# Patient Record
Sex: Female | Born: 2000 | Race: Black or African American | Hispanic: No | Marital: Single
Health system: Southern US, Community
[De-identification: ages and names within clinical notes are randomized; demographics above are authoritative.]

## PROBLEM LIST (undated history)

## (undated) ENCOUNTER — Ambulatory Visit (HOSPITAL_COMMUNITY): Payer: Medicaid Other

## (undated) ENCOUNTER — Ambulatory Visit (HOSPITAL_COMMUNITY): Payer: MEDICAID | Source: Home / Self Care

## (undated) DIAGNOSIS — L309 Dermatitis, unspecified: Secondary | ICD-10-CM

---
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
## 2020-04-01 ENCOUNTER — Ambulatory Visit: Payer: Medicaid Other | Attending: Internal Medicine

## 2020-04-01 DIAGNOSIS — Z23 Encounter for immunization: Secondary | ICD-10-CM

## 2020-04-01 NOTE — Progress Notes (Signed)
   Covid-19 Vaccination Clinic  Name:  Manette Doto    MRN: [id number] DOB: September 02, 2000  04/01/2020  Ms. Riveron was observed post Covid-19 immunization for 15 minutes without incident. She was provided with Vaccine Information Sheet and instruction to access the V-Safe system.   Ms. Shannahan was instructed to call 911 with any severe reactions post vaccine: Marland Kitchen Difficulty breathing  . Swelling of face and throat  . A fast heartbeat  . A bad rash all over body  . Dizziness and weakness   Immunizations Administered    Name Date Dose VIS Date Route   Pfizer COVID-19 Vaccine 04/01/2020  3:23 PM 0.3 mL 10/17/2018 Intramuscular   Manufacturer: [employer], [employer]   Lot: [id number]   NDC: [address]-1

## 2020-04-22 ENCOUNTER — Ambulatory Visit: Payer: Self-pay

## 2020-11-17 ENCOUNTER — Other Ambulatory Visit: Payer: Self-pay

## 2020-11-17 ENCOUNTER — Ambulatory Visit (HOSPITAL_COMMUNITY)
Admission: EM | Admit: 2020-11-17 | Discharge: 2020-11-17 | Discharge status: Against Medical Advice | Payer: Medicaid Other

## 2021-01-07 ENCOUNTER — Other Ambulatory Visit: Payer: Self-pay

## 2021-01-07 ENCOUNTER — Ambulatory Visit (HOSPITAL_COMMUNITY)
Admission: EM | Admit: 2021-01-07 | Discharge: 2021-01-07 | Disposition: A | Discharge status: Home / Self Care | Payer: Medicaid Other

## 2021-01-07 ENCOUNTER — Encounter (HOSPITAL_COMMUNITY): Payer: Self-pay

## 2021-01-07 DIAGNOSIS — R112 Nausea with vomiting, unspecified: Secondary | ICD-10-CM | POA: Diagnosis not present

## 2021-01-07 NOTE — ED Provider Notes (Signed)
[facility]    CSN: [id number] Arrival date & time: 01/07/21  1055      History   Chief Complaint Chief Complaint  Patient presents with  . Emesis    HPI Rachel Ashley is a 20 y.o. female.   HPI   Emesis: Pt reports that she started vomiting on Sunday. She had 3 episodes of vomiting on Sunday, one on Monday and had a queezy stomach yesterday. She reports that all of her symptoms have resolved. She reports no urinary frequency, dysuria, diarrhea, abdominal pain, nausea, or fevers. She would like a return to work note.   Past Medical History:  Diagnosis Date  . Eczema     There are no problems to display for this patient.   History reviewed. No pertinent surgical history.  OB History   No obstetric history on file.      Home Medications    Prior to Admission medications   Medication Sig Start Date End Date Taking? Authorizing Provider  cetirizine (ZYRTEC) 10 MG tablet Take 1 tablet (10 mg total) by mouth daily. 02/06/18   Cathie Hoops, Amy V, PA-C  mineral oil-hydrophilic petrolatum (AQUAPHOR) ointment Apply topically as needed for dry skin. 05/11/17   Sherrilee Gilles, NP  tacrolimus (PROTOPIC) 0.1 % ointment Apply topically 2 (two) times daily. Patient not taking: Reported on 11/12/2019 05/31/17   Mardella Layman, MD  triamcinolone ointment (KENALOG) 0.1 % Apply 1 application topically 2 (two) times daily. Patient not taking: Reported on 11/12/2019 02/06/18   Lurline Idol    Family History Family History  Problem Relation Age of Onset  . Healthy Mother   . Healthy Father     Social History Social History   Tobacco Use  . Smoking status: Never Smoker  . Smokeless tobacco: Never Used  Vaping Use  . Vaping Use: Never used  Substance Use Topics  . Alcohol use: Never  . Drug use: Never     Allergies   Red dye   Review of Systems Review of Systems  As stated above in HPI Physical Exam Triage Vital Signs ED Triage Vitals  Enc Vitals Group      BP 01/07/21 1245 106/61     Pulse Rate 01/07/21 1245 85     Resp 01/07/21 1245 18     Temp 01/07/21 1245 99 F (37.2 C)     Temp Source 01/07/21 1245 Oral     SpO2 01/07/21 1245 98 %     Weight --      Height --      Head Circumference --      Peak Flow --      Pain Score 01/07/21 1244 0     Pain Loc --      Pain Edu? --      Excl. in [place]? --    No data found.  Updated Vital Signs BP 106/61 (BP Location: Left Arm)   Pulse 85   Temp 99 F (37.2 C) (Oral)   Resp 18   LMP 01/03/2021 (Exact Date)   SpO2 98%   Physical Exam Vitals and nursing note reviewed.  Constitutional:      Appearance: Normal appearance.  HENT:     Head: Normocephalic and atraumatic.     Mouth/Throat:     Mouth: Mucous membranes are moist.  Cardiovascular:     Rate and Rhythm: Normal rate and regular rhythm.     Heart sounds: Normal heart sounds.  Pulmonary:  Effort: Pulmonary effort is normal.     Breath sounds: Normal breath sounds.  Abdominal:     General: Abdomen is flat. Bowel sounds are normal. There is no distension.     Palpations: Abdomen is soft. There is no mass.     Tenderness: There is no abdominal tenderness. There is no right CVA tenderness, left CVA tenderness, guarding or rebound.     Hernia: No hernia is present.  Musculoskeletal:     Cervical back: Neck supple.  Lymphadenopathy:     Cervical: No cervical adenopathy.  Skin:    General: Skin is warm.  Neurological:     Mental Status: She is alert and oriented to person, place, and time.      UC Treatments / Results  Labs (all labs ordered are listed, but only abnormal results are displayed) Labs Reviewed - No data to display  EKG   Radiology No results found.  Procedures Procedures (including critical care time)  Medications Ordered in UC Medications - No data to display  Initial Impression / Assessment and Plan / UC Course  I have reviewed the triage vital signs and the nursing notes.  Pertinent labs  & imaging results that were available during my care of the patient were reviewed by me and considered in my medical decision making (see chart for details).     New. Likely gastroenteritis. Discussed precautions. Work note discussed and written.  Final Clinical Impressions(s) / UC Diagnoses   Final diagnoses:  None   Discharge Instructions   None    ED Prescriptions    None     PDMP not reviewed this encounter.   Rushie Chestnut, [place] 01/07/21 1325

## 2021-01-07 NOTE — ED Triage Notes (Signed)
Pt in for c/o vomiting that started Sunday while at work but has resolved  Pt states she needs a work note

## 2021-02-19 ENCOUNTER — Ambulatory Visit (HOSPITAL_COMMUNITY): Payer: Self-pay

## 2021-08-13 ENCOUNTER — Ambulatory Visit (HOSPITAL_COMMUNITY)
Admission: EM | Admit: 2021-08-13 | Discharge: 2021-08-13 | Disposition: A | Discharge status: Home / Self Care | Payer: Medicaid Other | Attending: Family Medicine | Admitting: Family Medicine

## 2021-08-13 ENCOUNTER — Encounter (HOSPITAL_COMMUNITY): Payer: Self-pay | Admitting: Emergency Medicine

## 2021-08-13 ENCOUNTER — Other Ambulatory Visit: Payer: Self-pay

## 2021-08-13 DIAGNOSIS — Z113 Encounter for screening for infections with a predominantly sexual mode of transmission: Secondary | ICD-10-CM | POA: Diagnosis present

## 2021-08-13 DIAGNOSIS — Z202 Contact with and (suspected) exposure to infections with a predominantly sexual mode of transmission: Secondary | ICD-10-CM

## 2021-08-13 NOTE — ED Provider Notes (Signed)
°  [facility]   [id number] 08/13/21 Arrival Time: 1402  ASSESSMENT & PLAN:  1. Screening for STDs (sexually transmitted diseases)    No empiric treatment.    Discharge Instructions      We have sent testing for sexually transmitted infections. We will notify you of any positive results once they are received. If required, we will prescribe any medications you might need.  Please refrain from all sexual activity for at least the next seven days.     Without s/s of PID.  Labs Reviewed  CERVICOVAGINAL ANCILLARY ONLY      Will notify of any positive results. Instructed to refrain from sexual activity for at least seven days.  Reviewed expectations re: course of current medical issues. Questions answered. Outlined signs and symptoms indicating need for more acute intervention. Patient verbalized understanding. After Visit Summary given.   SUBJECTIVE:  Rachel Ashley is a 20 y.o. female who requests STI check. No symptoms. Denies: urinary frequency, dysuria, and gross hematuria. Afebrile. No abdominal or pelvic pain. Normal PO intake wihout n/v. No genital rashes or lesions. Reports that she is sexually active with single female partner.  Patient's last menstrual period was 08/11/2021 (exact date).   OBJECTIVE:  Vitals:   08/13/21 1446  BP: 111/85  Pulse: (!) 106  Resp: 17  Temp: 97.8 F (36.6 C)  TempSrc: Oral  SpO2: 98%     General appearance: alert, cooperative, appears stated age and no distress Lungs: unlabored respirations; speaks full sentences without difficulty Back: no CVA tenderness; FROM at waist Abdomen: soft, non-tender GU: deferred Skin: warm and dry Psychological: alert and cooperative; normal mood and affect.    Labs Reviewed  CERVICOVAGINAL ANCILLARY ONLY    Allergies  Allergen Reactions   Red Dye     Past Medical History:  Diagnosis Date   Eczema    Family History  Problem Relation Age of Onset   Healthy Mother     Healthy Father    Social History   Socioeconomic History   Marital status: Single    Spouse name: Not on file   Number of children: Not on file   Years of education: Not on file   Highest education level: Not on file  Occupational History   Not on file  Tobacco Use   Smoking status: Never   Smokeless tobacco: Never  Vaping Use   Vaping Use: Never used  Substance and Sexual Activity   Alcohol use: Never   Drug use: Never   Sexual activity: Not on file  Other Topics Concern   Not on file  Social History Narrative   Not on file   Social Determinants of Health   Financial Resource Strain: Not on file  Food Insecurity: Not on file  Transportation Needs: Not on file  Physical Activity: Not on file  Stress: Not on file  Social Connections: Not on file  Intimate Partner Violence: Not on file           Mardella Layman, MD 08/13/21 1524

## 2021-08-13 NOTE — Discharge Instructions (Addendum)
We have sent testing for sexually transmitted infections. We will notify you of any positive results once they are received. If required, we will prescribe any medications you might need.  Please refrain from all sexual activity for at least the next seven days.  

## 2021-08-13 NOTE — ED Triage Notes (Signed)
Pt is present today for STD check. Pt denies any sx  

## 2021-08-14 ENCOUNTER — Telehealth (HOSPITAL_COMMUNITY): Payer: Self-pay | Admitting: Emergency Medicine

## 2021-08-14 LAB — CERVICOVAGINAL ANCILLARY ONLY
Bacterial Vaginitis (gardnerella): POSITIVE — AB
Candida Glabrata: NEGATIVE
Candida Vaginitis: POSITIVE — AB
Chlamydia: NEGATIVE
Comment: NEGATIVE
Comment: NEGATIVE
Comment: NEGATIVE
Comment: NEGATIVE
Comment: NEGATIVE
Comment: NORMAL
Neisseria Gonorrhea: NEGATIVE
Trichomonas: NEGATIVE

## 2021-08-14 MED ORDER — TERCONAZOLE 0.4 % VA CREA: 1.0000 | TOPICAL_CREAM | Freq: Every day | VAGINAL | 0 refills | Status: AC

## 2021-08-14 MED ORDER — METRONIDAZOLE 500 MG PO TABS: 500.0000 mg | ORAL_TABLET | Freq: Two times a day (BID) | ORAL | 0 refills | Status: DC

## 2021-08-15 IMAGING — DX DG KNEE COMPLETE 4+V*L*
4 series · 5 of 5 positions shown · non-contrast
Comparison: No prior.

CLINICAL DATA: Knee pain.

EXAM:
LEFT KNEE - COMPLETE 4+ VIEW

[knee ap]
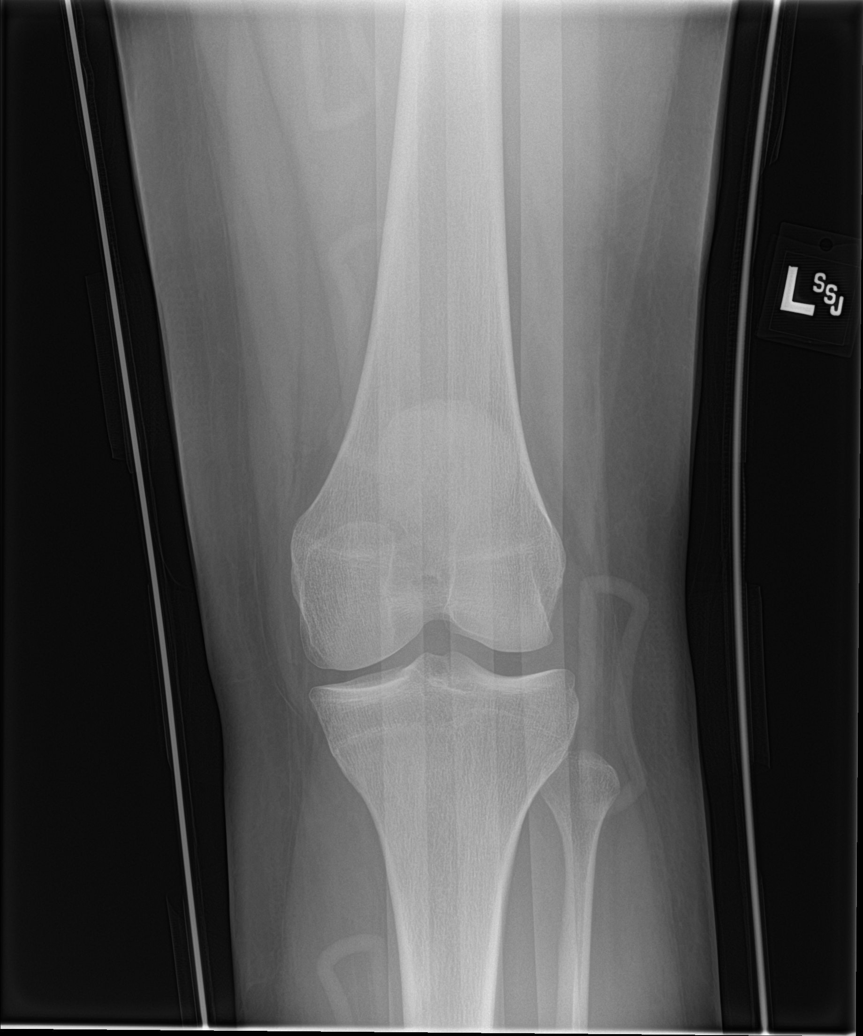

[Series 3: knee lat · 0.14mm/px · 2 of 2 slices shown]
[im 1/2]
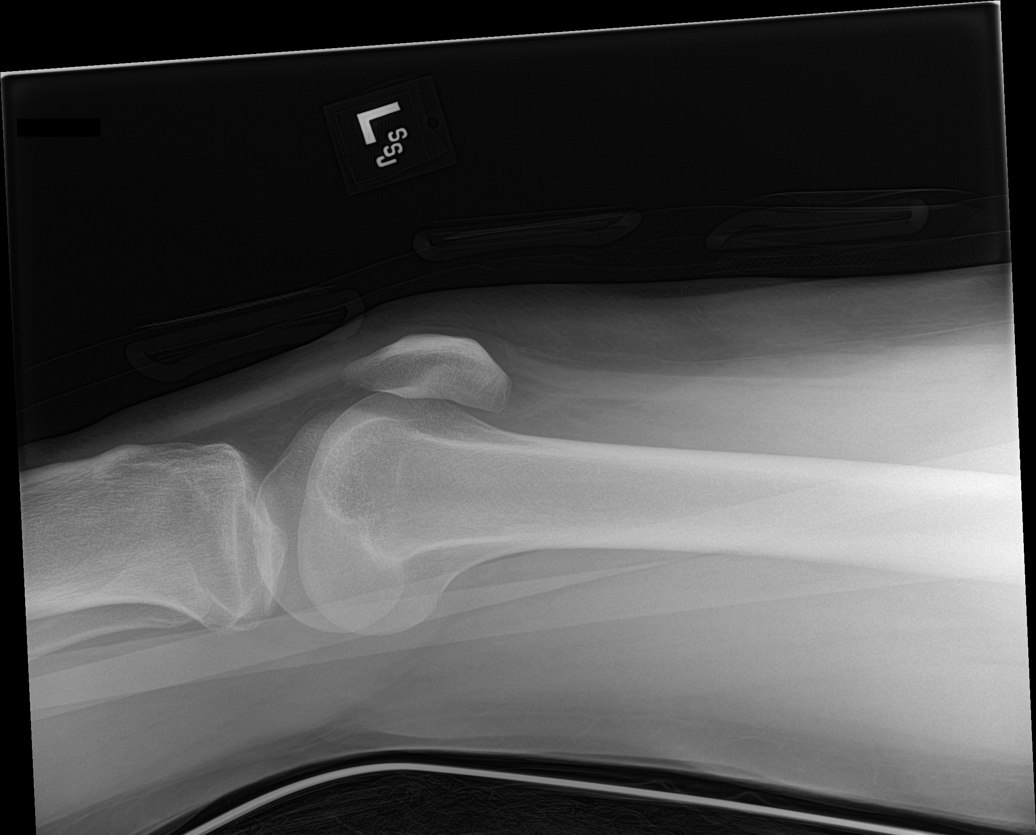
[im 2/2]
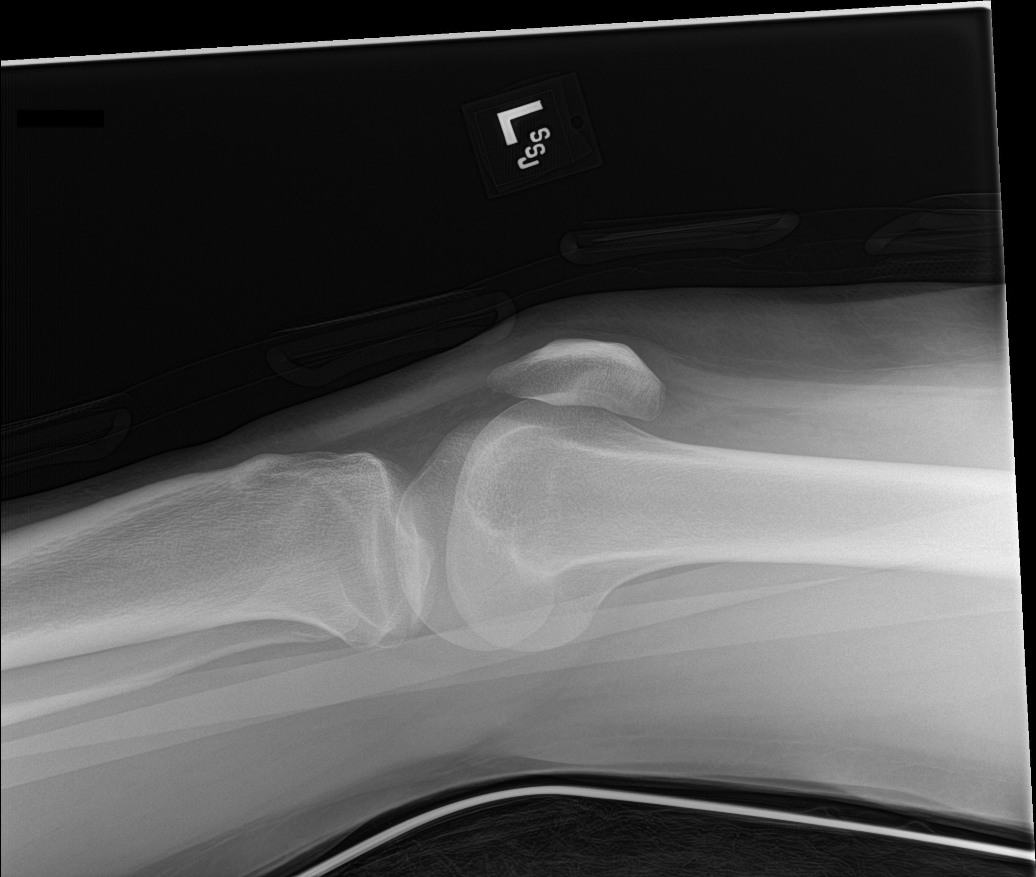

[knee obl (1 of 2)]
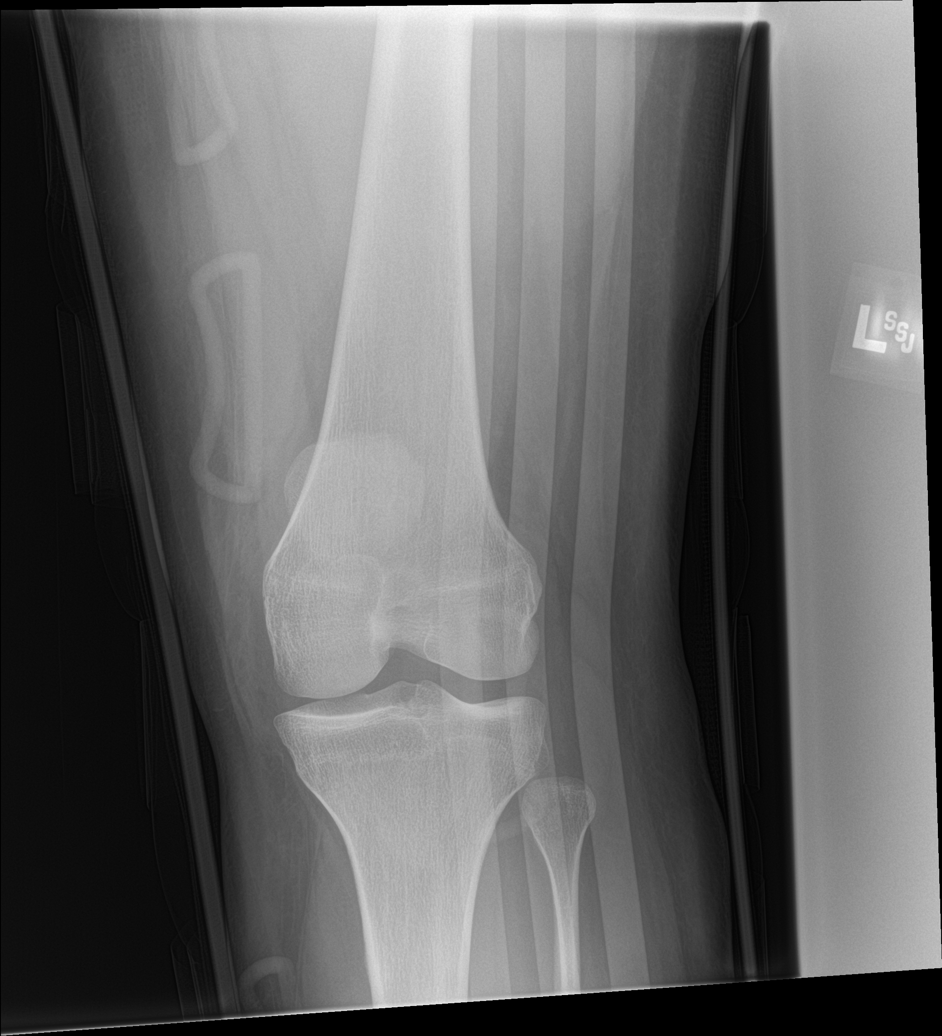

[knee obl (2 of 2)]
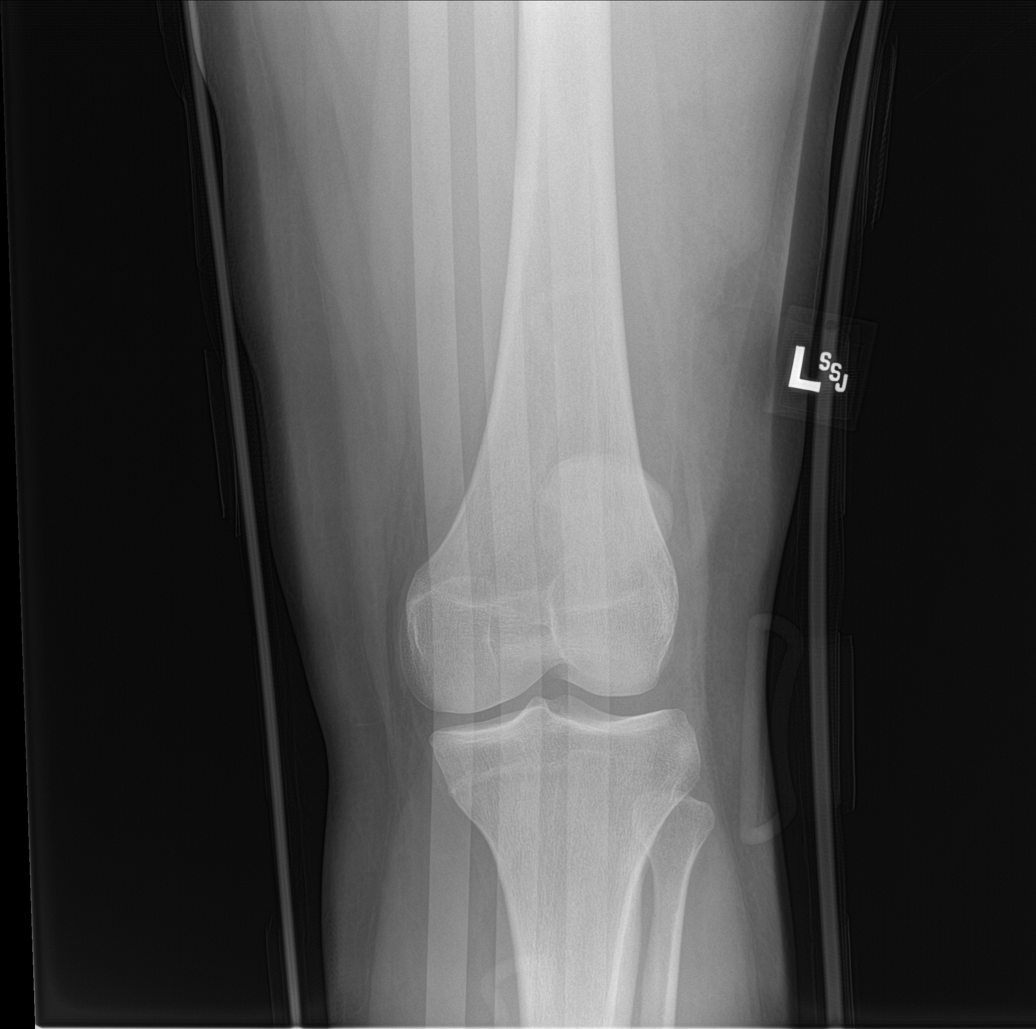

[5 of 5 positions shown; findings below may reference images not displayed]

FINDINGS: No acute bony or joint abnormality identified. No evidence of
fracture or dislocation. No evidence of effusion.
IMPRESSION: No acute abnormality.

## 2021-08-21 ENCOUNTER — Encounter (HOSPITAL_COMMUNITY): Payer: Self-pay | Admitting: Emergency Medicine

## 2021-08-21 ENCOUNTER — Emergency Department (HOSPITAL_COMMUNITY): Payer: Medicaid Other

## 2021-08-21 ENCOUNTER — Emergency Department (HOSPITAL_COMMUNITY)
Admission: EM | Admit: 2021-08-21 | Discharge: 2021-08-22 | Disposition: A | Discharge status: Home / Self Care | Payer: Medicaid Other | Attending: Emergency Medicine | Admitting: Emergency Medicine

## 2021-08-21 ENCOUNTER — Other Ambulatory Visit: Payer: Self-pay

## 2021-08-21 DIAGNOSIS — M25571 Pain in right ankle and joints of right foot: Secondary | ICD-10-CM | POA: Diagnosis present

## 2021-08-21 DIAGNOSIS — Z5321 Procedure and treatment not carried out due to patient leaving prior to being seen by health care provider: Secondary | ICD-10-CM | POA: Diagnosis not present
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
## 2021-08-21 NOTE — ED Notes (Signed)
No response x2 ?
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
## 2022-12-08 ENCOUNTER — Other Ambulatory Visit: Payer: Self-pay

## 2022-12-08 ENCOUNTER — Encounter (HOSPITAL_COMMUNITY): Payer: Self-pay

## 2022-12-08 ENCOUNTER — Emergency Department (HOSPITAL_COMMUNITY)
Admission: EM | Admit: 2022-12-08 | Discharge: 2022-12-08 | Discharge status: Against Medical Advice | Payer: Medicaid Other | Attending: Emergency Medicine | Admitting: Emergency Medicine

## 2022-12-08 DIAGNOSIS — F410 Panic disorder [episodic paroxysmal anxiety] without agoraphobia: Secondary | ICD-10-CM | POA: Insufficient documentation

## 2022-12-08 DIAGNOSIS — Z5321 Procedure and treatment not carried out due to patient leaving prior to being seen by health care provider: Secondary | ICD-10-CM | POA: Insufficient documentation

## 2022-12-08 NOTE — ED Notes (Signed)
Eloped through triage with mother. Says she no longer wishes to be evaluated by ER staff.
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
## 2022-12-08 NOTE — ED Notes (Signed)
Pt removed IV herself before eloping.

## 2023-01-21 ENCOUNTER — Encounter (HOSPITAL_COMMUNITY): Payer: Self-pay | Admitting: Emergency Medicine

## 2023-01-21 ENCOUNTER — Ambulatory Visit (HOSPITAL_COMMUNITY)
Admission: EM | Admit: 2023-01-21 | Discharge: 2023-01-21 | Disposition: A | Discharge status: Home / Self Care | Payer: Medicaid Other | Attending: Family Medicine | Admitting: Family Medicine

## 2023-01-21 DIAGNOSIS — N760 Acute vaginitis: Secondary | ICD-10-CM | POA: Diagnosis present

## 2023-01-21 LAB — POCT URINALYSIS DIP (MANUAL ENTRY)
Bilirubin, UA: NEGATIVE
Blood, UA: NEGATIVE
Glucose, UA: NEGATIVE mg/dL
Ketones, POC UA: NEGATIVE mg/dL
Nitrite, UA: NEGATIVE
Protein Ur, POC: NEGATIVE mg/dL
Spec Grav, UA: 1.02 (ref 1.010–1.025)
Urobilinogen, UA: 1 E.U./dL
pH, UA: 7 (ref 5.0–8.0)

## 2023-01-21 LAB — POCT URINE PREGNANCY: Preg Test, Ur: NEGATIVE

## 2023-01-21 MED ORDER — METRONIDAZOLE 500 MG PO TABS: 500.0000 mg | ORAL_TABLET | Freq: Two times a day (BID) | ORAL | 0 refills | Status: DC

## 2023-01-21 MED ORDER — TERCONAZOLE 0.8 % VA CREA: 1.0000 | TOPICAL_CREAM | Freq: Every day | VAGINAL | 0 refills | Status: AC

## 2023-01-21 NOTE — Discharge Instructions (Addendum)
Vaginal cytology pending and will result within 24 to 48 hours.  If any additional treatment is warranted after your results are available someone from our office will call you.  If not receive a call or a MyChart message no additional treatment is needed.

## 2023-01-21 NOTE — ED Triage Notes (Signed)
Pt c/o vaginal discharge and itching with bad odor that started yesterday. Pr report "dead skin" when wipes on tissue. Pt waxes.

## 2023-01-21 NOTE — ED Provider Notes (Signed)
[facility]    CSN: [id number] Arrival date & time: 01/21/23  1729      History   Chief Complaint Chief Complaint  Patient presents with   Vaginal Itching    HPI Rachel Ashley is a 22 y.o. female.   HPI Patient here for evaluation of abnormal vaginal discharge, abnormal vaginal odor and itching.  Patient endorses that she is sexually active and has no knowledge of being exposed to any STDs.  She reports symptoms have been present for about a day.  She does have a history of BV and yeast.  Endorses that the symptoms feel similar as before.  She denies any dysuria, flank pain nausea or vomiting. Patient's last menstrual period was 01/10/2023.    Past Medical History:  Diagnosis Date   Eczema     There are no problems to display for this patient.   History reviewed. No pertinent surgical history.  OB History   No obstetric history on file.      Home Medications    Prior to Admission medications   Medication Sig Start Date End Date Taking? Authorizing Provider  terconazole (TERAZOL 3) 0.8 % vaginal cream Place 1 applicator vaginally at bedtime for 3 days. 01/21/23 01/24/23 Yes Bing Neighbors, NP  cetirizine (ZYRTEC) 10 MG tablet Take 1 tablet (10 mg total) by mouth daily. 02/06/18   Cathie Hoops, Amy V, PA-C  metroNIDAZOLE (FLAGYL) 500 MG tablet Take 1 tablet (500 mg total) by mouth 2 (two) times daily. 01/21/23   Bing Neighbors, NP  mineral oil-hydrophilic petrolatum (AQUAPHOR) ointment Apply topically as needed for dry skin. 05/11/17   Sherrilee Gilles, NP  tacrolimus (PROTOPIC) 0.1 % ointment Apply topically 2 (two) times daily. Patient not taking: Reported on 11/12/2019 05/31/17   Mardella Layman, MD  triamcinolone ointment (KENALOG) 0.1 % Apply 1 application topically 2 (two) times daily. Patient not taking: Reported on 11/12/2019 02/06/18   Lurline Idol    Family History Family History  Problem Relation Age of Onset   Healthy Mother    Healthy Father      Social History Social History   Tobacco Use   Smoking status: Never   Smokeless tobacco: Never  Vaping Use   Vaping Use: Never used  Substance Use Topics   Alcohol use: Never   Drug use: Never     Allergies   Red dye   Review of Systems Review of Systems Pertinent negatives listed in HPI   Physical Exam Triage Vital Signs ED Triage Vitals  Enc Vitals Group     BP 01/21/23 1742 113/77     Pulse Rate 01/21/23 1742 94     Resp 01/21/23 1742 15     Temp 01/21/23 1742 98.8 F (37.1 C)     Temp Source 01/21/23 1742 Oral     SpO2 01/21/23 1742 98 %     Weight --      Height --      Head Circumference --      Peak Flow --      Pain Score 01/21/23 1740 0     Pain Loc --      Pain Edu? --      Excl. in GC? --    No data found.  Updated Vital Signs BP 113/77 (BP Location: Right Arm)   Pulse 94   Temp 98.8 F (37.1 C) (Oral)   Resp 15   LMP 01/10/2023   SpO2 98%   Visual  Acuity Right Eye Distance:   Left Eye Distance:   Bilateral Distance:    Right Eye Near:   Left Eye Near:    Bilateral Near:     Physical Exam General appearance: Alert, well developed, well nourished, cooperative  Head: Normocephalic, without obvious abnormality, atraumatic Respiratory: Respirations even and unlabored, normal respiratory rate Heart: rate and rhythm normal. No gallop or murmurs noted on exam  Abdomen: BS +, no distention, no rebound tenderness, or no mass Extremities: No gross deformities Skin: Skin color, texture, turgor normal. No rashes seen  Psych: Appropriate mood and affect. Neurologic: Mental status: Alert, oriented to person, place, and time, thought content appropriate.  UC Treatments / Results  Labs (all labs ordered are listed, but only abnormal results are displayed) Labs Reviewed  POCT URINALYSIS DIP (MANUAL ENTRY) - Abnormal; Notable for the following components:      Result Value   Leukocytes, UA Trace (*)    All other components within normal  limits  POCT URINE PREGNANCY  CERVICOVAGINAL ANCILLARY ONLY    EKG   Radiology No results found.  Procedures Procedures (including critical care time)  Medications Ordered in UC Medications - No data to display  Initial Impression / Assessment and Plan / UC Course  I have reviewed the triage vital signs and the nursing notes.  Pertinent labs & imaging results that were available during my care of the patient were reviewed by me and considered in my medical decision making (see chart for details).    Vaginal Cytology pending. Treating for vaginitis while awaiting results. Urine HCG negative. UA unremarkable.  Patient advised that we will only contact her if additional treatment is warranted after review of her results. Final Clinical Impressions(s) / UC Diagnoses   Final diagnoses:  Vaginitis and vulvovaginitis     Discharge Instructions      Vaginal cytology pending and will result within 24 to 48 hours.  If any additional treatment is warranted after your results are available someone from our office will call you.  If not receive a call or a MyChart message no additional treatment is needed.     ED Prescriptions     Medication Sig Dispense Auth. Provider   metroNIDAZOLE (FLAGYL) 500 MG tablet Take 1 tablet (500 mg total) by mouth 2 (two) times daily. 14 tablet Bing Neighbors, NP   terconazole (TERAZOL 3) 0.8 % vaginal cream Place 1 applicator vaginally at bedtime for 3 days. 20 g Bing Neighbors, NP      PDMP not reviewed this encounter.   Bing Neighbors, NP 01/21/23 [phone]

## 2023-01-25 LAB — CERVICOVAGINAL ANCILLARY ONLY
Bacterial Vaginitis (gardnerella): POSITIVE — AB
Candida Glabrata: NEGATIVE
Candida Vaginitis: POSITIVE — AB
Chlamydia: NEGATIVE
Comment: NEGATIVE
Comment: NEGATIVE
Comment: NEGATIVE
Comment: NEGATIVE
Comment: NEGATIVE
Comment: NORMAL
Neisseria Gonorrhea: NEGATIVE
Trichomonas: NEGATIVE

## 2023-02-22 ENCOUNTER — Encounter (HOSPITAL_COMMUNITY): Payer: Self-pay

## 2023-02-22 ENCOUNTER — Other Ambulatory Visit: Payer: Self-pay

## 2023-02-22 ENCOUNTER — Emergency Department (HOSPITAL_COMMUNITY)
Admission: EM | Admit: 2023-02-22 | Discharge: 2023-02-22 | Discharge status: Against Medical Advice | Payer: Medicaid Other | Attending: Emergency Medicine | Admitting: Emergency Medicine

## 2023-02-22 DIAGNOSIS — F410 Panic disorder [episodic paroxysmal anxiety] without agoraphobia: Secondary | ICD-10-CM | POA: Diagnosis present

## 2023-02-22 DIAGNOSIS — Z5321 Procedure and treatment not carried out due to patient leaving prior to being seen by health care provider: Secondary | ICD-10-CM | POA: Insufficient documentation

## 2023-02-22 NOTE — ED Triage Notes (Signed)
Pt arrived from home via Pov c/o being overwhelmed. Pt denies suicidal ideation. Pt crying in triage as she repeats that she is just very overwhelmed right now.

## 2023-02-22 NOTE — ED Notes (Signed)
Pt feels better and is leaving. Pt educated on leaving before exam.

## 2023-03-15 ENCOUNTER — Encounter (HOSPITAL_COMMUNITY): Payer: Self-pay

## 2023-03-15 ENCOUNTER — Ambulatory Visit (HOSPITAL_COMMUNITY)
Admission: RE | Admit: 2023-03-15 | Discharge: 2023-03-15 | Disposition: A | Discharge status: Home / Self Care | Payer: Medicaid Other | Source: Ambulatory Visit | Attending: Family Medicine | Admitting: Family Medicine

## 2023-03-15 VITALS — BP 112/77 | HR 86 | Temp 98.1°F | Resp 16

## 2023-03-15 DIAGNOSIS — Z113 Encounter for screening for infections with a predominantly sexual mode of transmission: Secondary | ICD-10-CM | POA: Insufficient documentation

## 2023-03-15 NOTE — Discharge Instructions (Signed)
We have sent testing for sexually transmitted infections. We will notify you of any positive results once they are received. If required, we will prescribe any medications you might need.  Please refrain from all sexual activity for at least the next seven days.

## 2023-03-15 NOTE — ED Provider Notes (Signed)
  [facility]   [id number] 03/15/23 Arrival Time: 1010  ASSESSMENT & PLAN:  1. Screening for STDs (sexually transmitted diseases)      Discharge Instructions      We have sent testing for sexually transmitted infections. We will notify you of any positive results once they are received. If required, we will prescribe any medications you might need.  Please refrain from all sexual activity for at least the next seven days.     Declines HIV/RPR testing. Without s/s of PID.  Labs Reviewed  CERVICOVAGINAL ANCILLARY ONLY   Will notify of any positive results. Instructed to refrain from sexual activity for at least seven days.  Reviewed expectations re: course of current medical issues. Questions answered. Outlined signs and symptoms indicating need for more acute intervention. Patient verbalized understanding. After Visit Summary given.   SUBJECTIVE:  Rachel Ashley is a 22 y.o. female who presents requests STI check up. She denies any exposures or sx.   with complaint of vaginal discharge.   Patient's last menstrual period was 03/04/2023 (exact date).   OBJECTIVE:  Vitals:   03/15/23 1038  BP: 112/77  Pulse: 86  Resp: 16  Temp: 98.1 F (36.7 C)  TempSrc: Oral  SpO2: 98%     General appearance: alert, cooperative, appears stated age and no distress Lungs: unlabored respirations; speaks full sentences without difficulty Back: no CVA tenderness; FROM at waist Abdomen: soft, non-tender GU: deferred Skin: warm and dry Psychological: alert and cooperative; normal mood and affect.    Labs Reviewed  CERVICOVAGINAL ANCILLARY ONLY    Allergies  Allergen Reactions   Red Dye     Past Medical History:  Diagnosis Date   Eczema    Family History  Problem Relation Age of Onset   Healthy Mother    Healthy Father    Social History   Socioeconomic History   Marital status: Single    Spouse name: Not on file   Number of children: Not on file    Years of education: Not on file   Highest education level: Not on file  Occupational History   Not on file  Tobacco Use   Smoking status: Some Days    Types: Cigarettes   Smokeless tobacco: Never  Vaping Use   Vaping status: Never Used  Substance and Sexual Activity   Alcohol use: Yes   Drug use: Not Currently    Types: Marijuana   Sexual activity: Yes    Birth control/protection: None  Other Topics Concern   Not on file  Social History Narrative   Not on file   Social Determinants of Health   Financial Resource Strain: Not on file  Food Insecurity: Not on file  Transportation Needs: Not on file  Physical Activity: Not on file  Stress: Not on file  Social Connections: Not on file  Intimate Partner Violence: Not on file           Mardella Layman, MD 03/15/23 1101

## 2023-03-15 NOTE — ED Triage Notes (Signed)
Pt states she would like STI check up. She denies any exposures or sx.

## 2023-03-16 ENCOUNTER — Telehealth: Payer: Self-pay | Admitting: Emergency Medicine

## 2023-03-16 LAB — CERVICOVAGINAL ANCILLARY ONLY
Bacterial Vaginitis (gardnerella): POSITIVE — AB
Candida Glabrata: NEGATIVE
Candida Vaginitis: NEGATIVE
Chlamydia: NEGATIVE
Comment: NEGATIVE
Comment: NEGATIVE
Comment: NEGATIVE
Comment: NEGATIVE
Comment: NEGATIVE
Comment: NORMAL
Neisseria Gonorrhea: NEGATIVE
Trichomonas: NEGATIVE

## 2023-03-16 MED ORDER — METRONIDAZOLE 500 MG PO TABS: 500.0000 mg | ORAL_TABLET | Freq: Two times a day (BID) | ORAL | 0 refills | Status: DC

## 2023-06-02 ENCOUNTER — Ambulatory Visit (HOSPITAL_COMMUNITY): Payer: Medicaid Other

## 2023-08-24 IMAGING — CR DG ANKLE COMPLETE 3+V*R*
3 series · 3 of 3 positions shown · non-contrast
Comparison: None.

CLINICAL DATA: Right ankle pain following injury

EXAM:
RIGHT ANKLE - COMPLETE 3+ VIEW

[ankle ap]
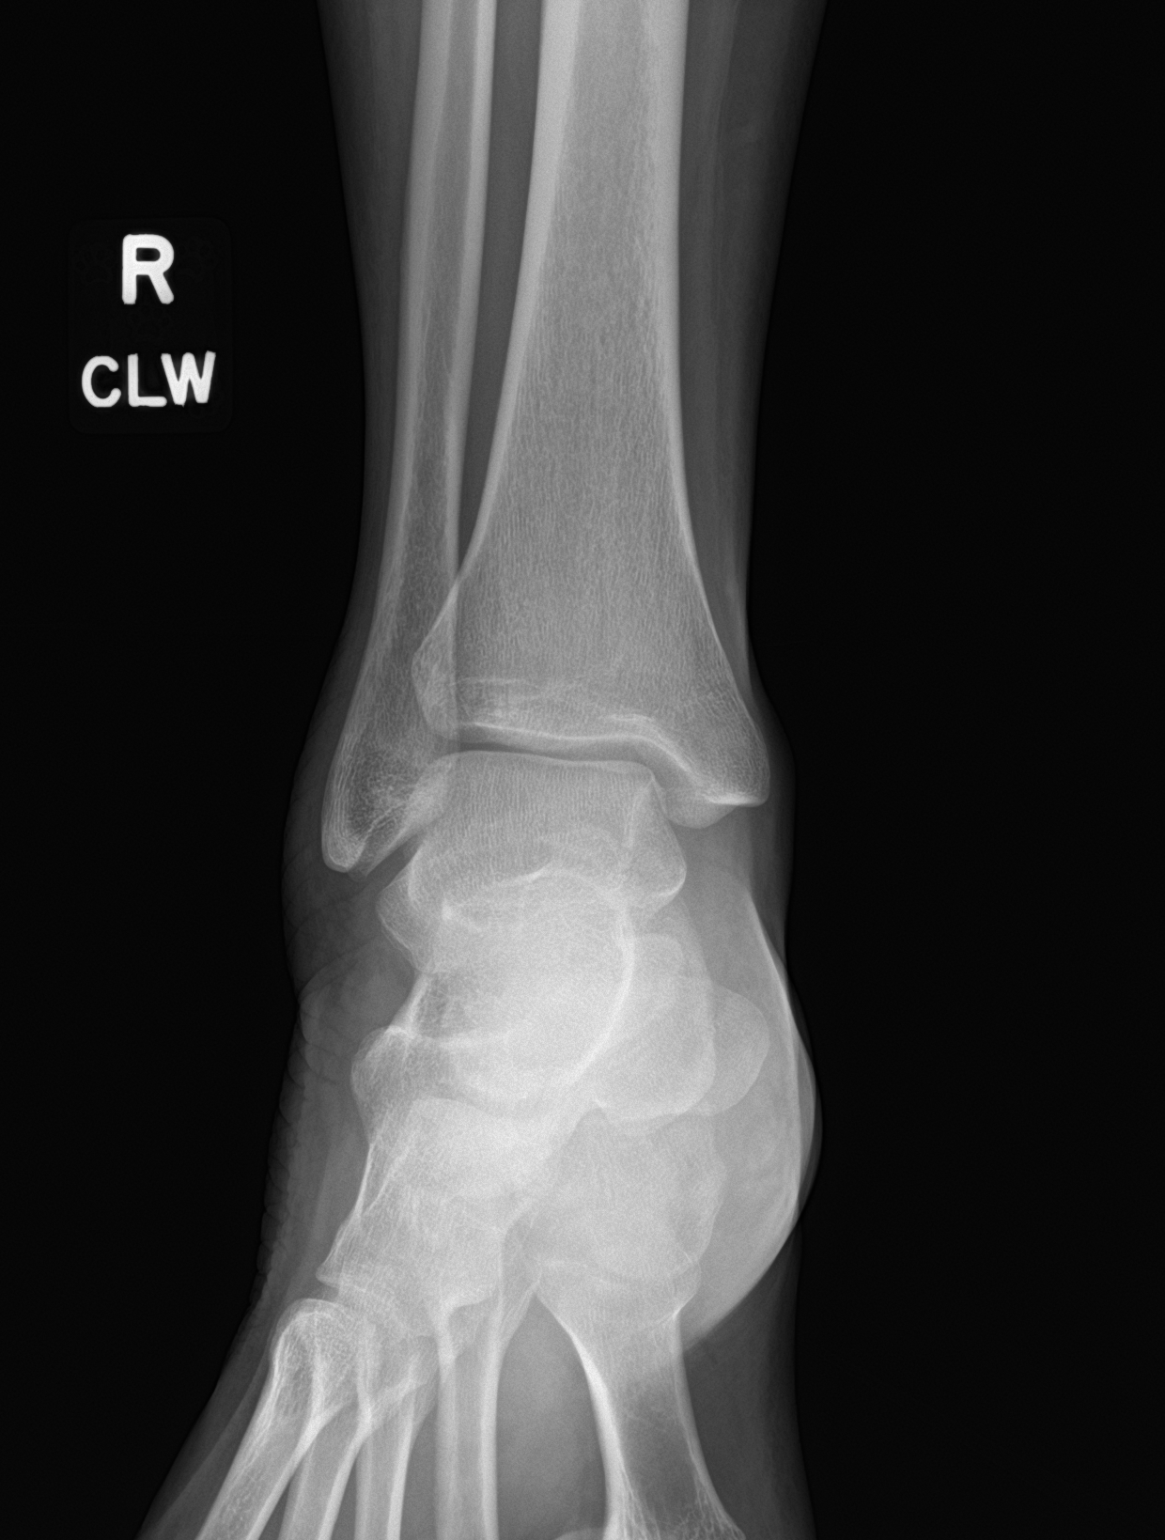

[ankle obl]
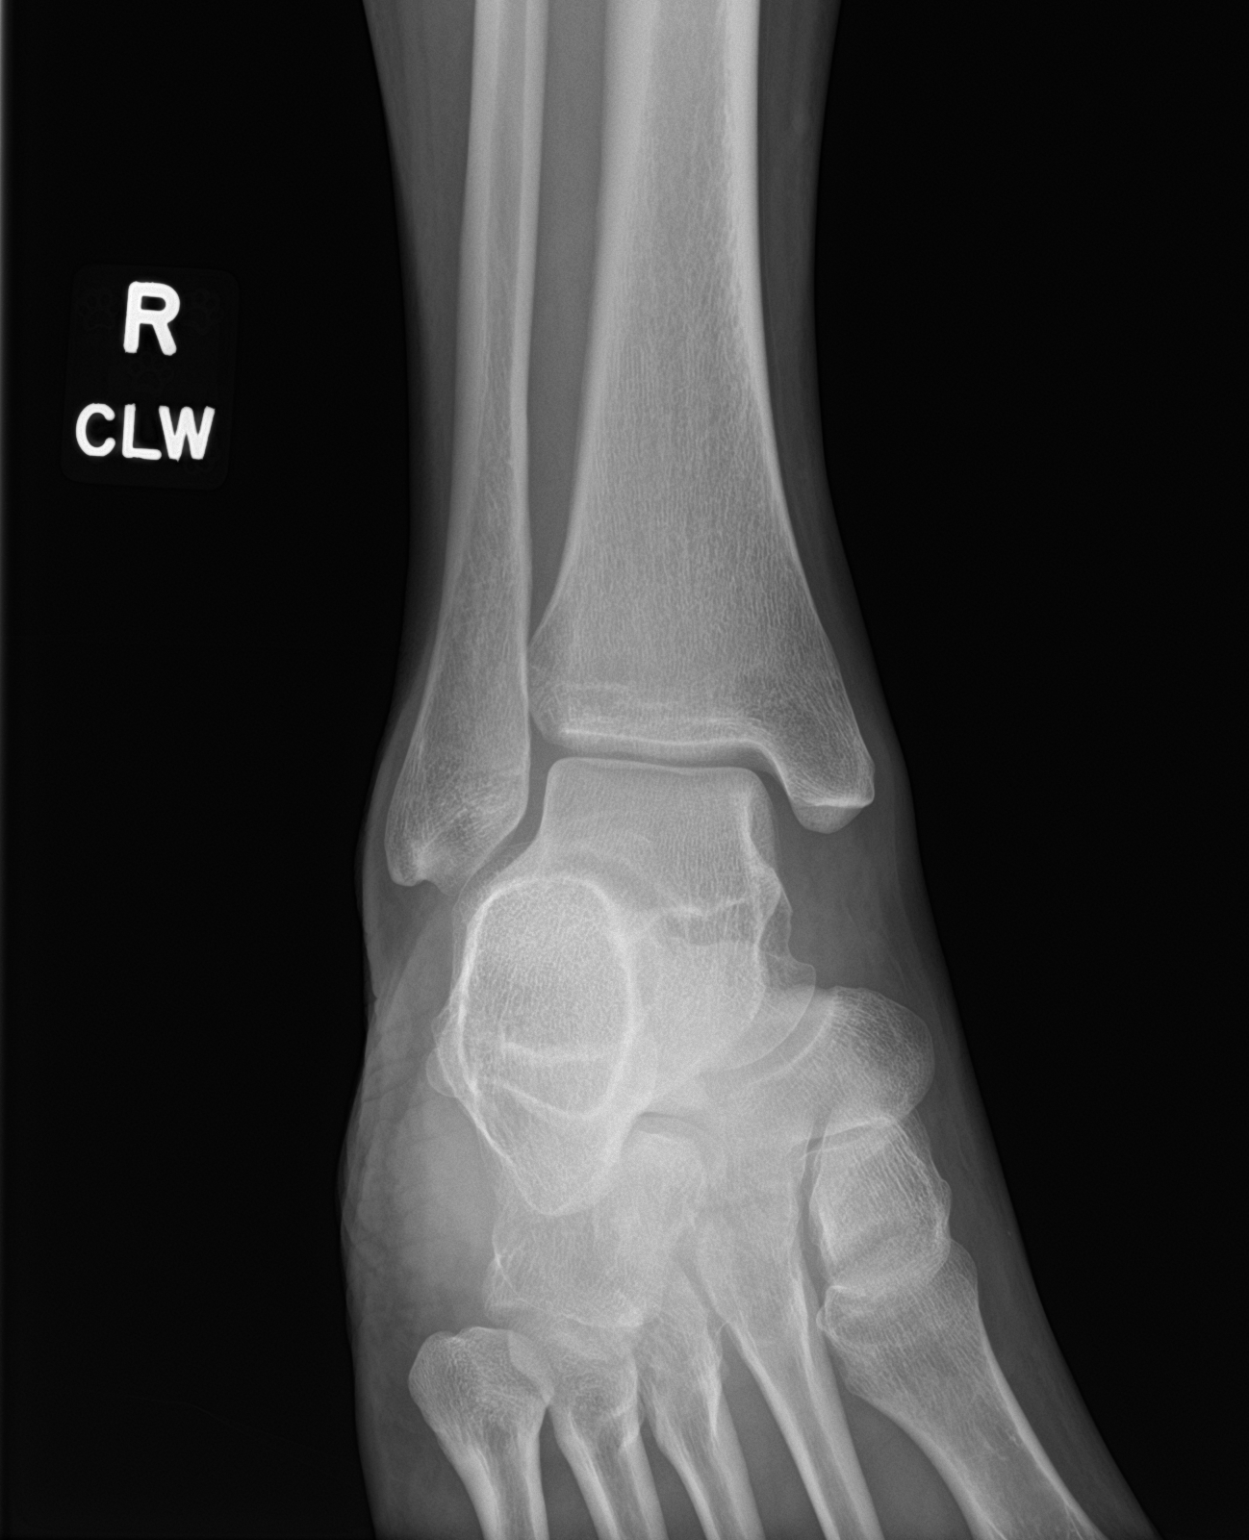

[ankle lat]
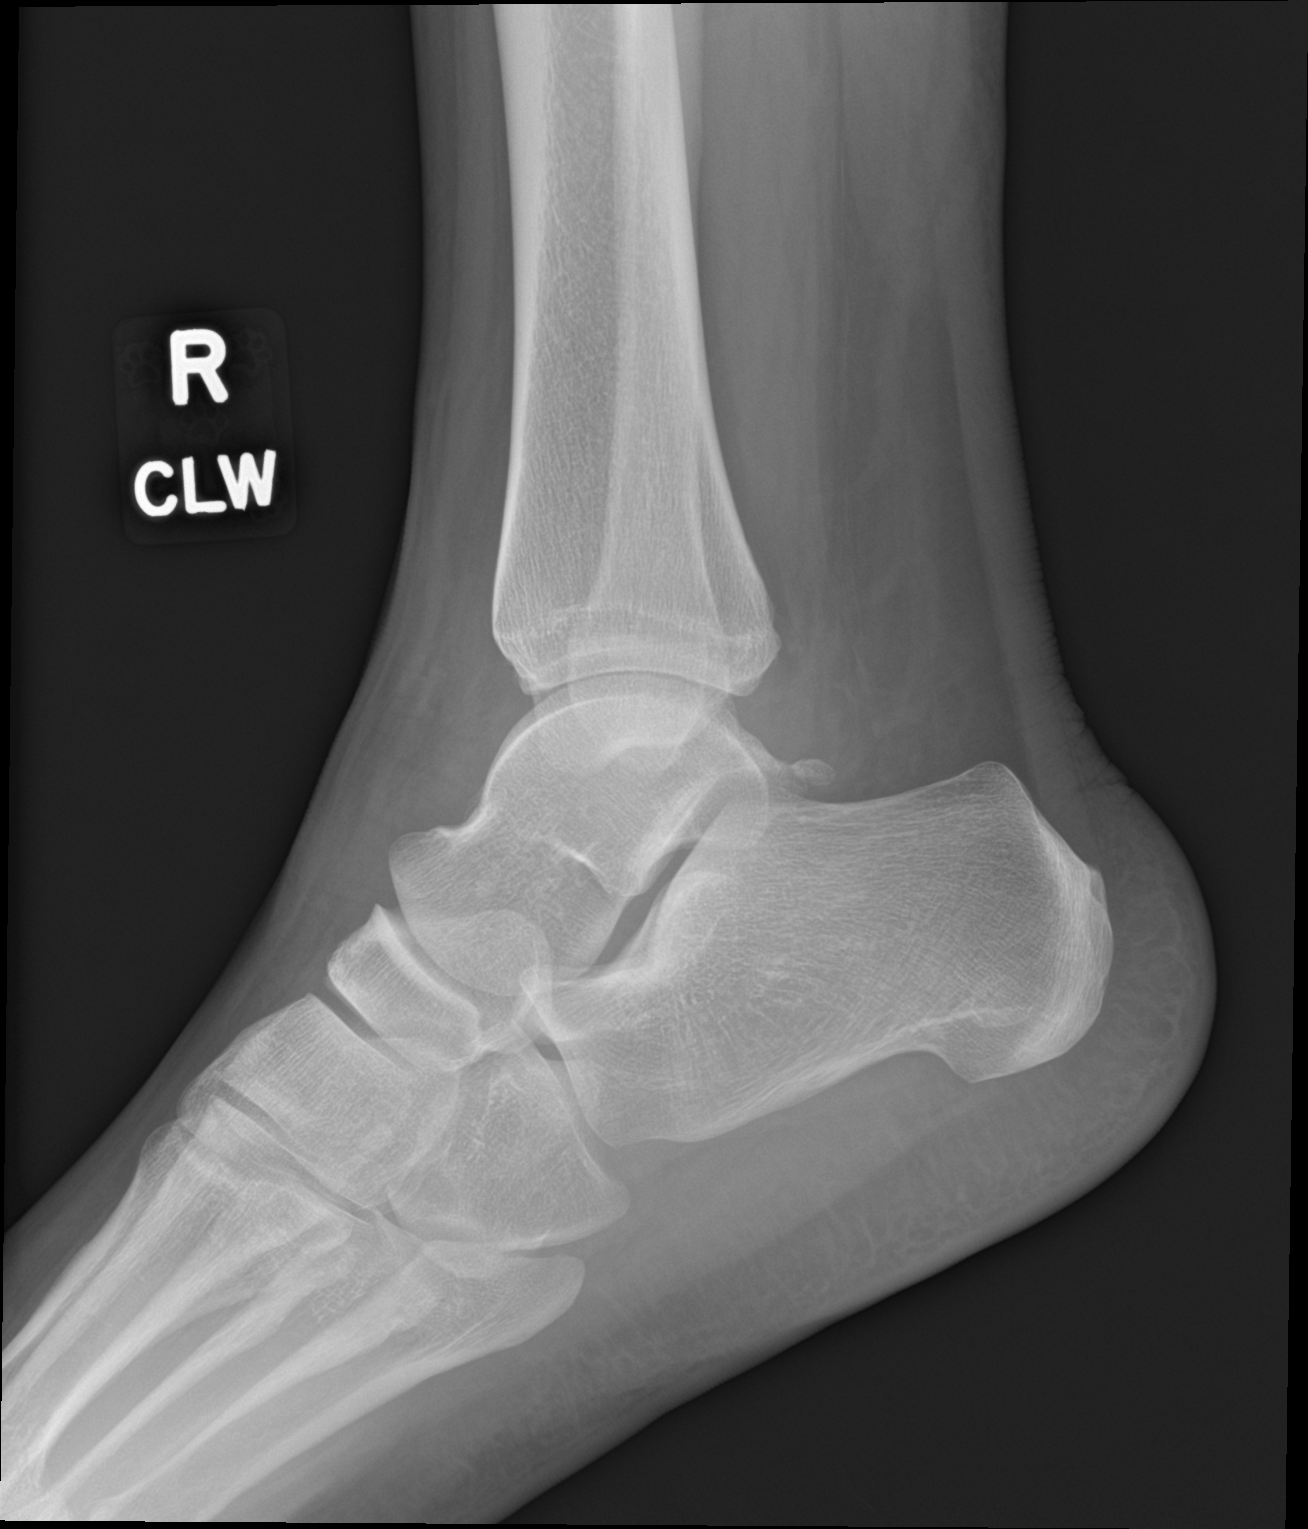

[3 of 3 positions shown; findings below may reference images not displayed]

FINDINGS: There is no evidence of fracture, dislocation, or joint effusion.
There is no evidence of arthropathy or other focal bone abnormality.
Soft tissues are unremarkable.
IMPRESSION: No acute osseous abnormality.

## 2023-08-27 ENCOUNTER — Emergency Department (HOSPITAL_COMMUNITY): Payer: MEDICAID

## 2023-08-27 ENCOUNTER — Emergency Department (HOSPITAL_COMMUNITY)
Admission: EM | Admit: 2023-08-27 | Discharge: 2023-08-27 | Disposition: A | Discharge status: Home / Self Care | Payer: MEDICAID | Attending: Emergency Medicine | Admitting: Emergency Medicine

## 2023-08-27 ENCOUNTER — Other Ambulatory Visit: Payer: Self-pay

## 2023-08-27 DIAGNOSIS — Y907 Blood alcohol level of 200-239 mg/100 ml: Secondary | ICD-10-CM | POA: Diagnosis not present

## 2023-08-27 DIAGNOSIS — F10920 Alcohol use, unspecified with intoxication, uncomplicated: Secondary | ICD-10-CM | POA: Diagnosis present

## 2023-08-27 LAB — CBC WITH DIFFERENTIAL/PLATELET
Abs Immature Granulocytes: 0.02 10*3/uL (ref 0.00–0.07)
Basophils Absolute: 0 10*3/uL (ref 0.0–0.1)
Basophils Relative: 1 %
Eosinophils Absolute: 0.1 10*3/uL (ref 0.0–0.5)
Eosinophils Relative: 1 %
HCT: 37.2 % (ref 36.0–46.0)
Hemoglobin: 12.6 g/dL (ref 12.0–15.0)
Immature Granulocytes: 0 %
Lymphocytes Relative: 34 %
Lymphs Abs: 2.8 10*3/uL (ref 0.7–4.0)
MCH: 31.8 pg (ref 26.0–34.0)
MCHC: 33.9 g/dL (ref 30.0–36.0)
MCV: 93.9 fL (ref 80.0–100.0)
Monocytes Absolute: 0.7 10*3/uL (ref 0.1–1.0)
Monocytes Relative: 9 %
Neutro Abs: 4.7 10*3/uL (ref 1.7–7.7)
Neutrophils Relative %: 55 %
Platelets: 337 10*3/uL (ref 150–400)
RBC: 3.96 MIL/uL (ref 3.87–5.11)
RDW: 11.9 % (ref 11.5–15.5)
WBC: 8.5 10*3/uL (ref 4.0–10.5)
nRBC: 0 % (ref 0.0–0.2)

## 2023-08-27 LAB — I-STAT CHEM 8, ED
BUN: 11 mg/dL (ref 6–20)
Calcium, Ion: 1.12 mmol/L — ABNORMAL LOW (ref 1.15–1.40)
Chloride: 109 mmol/L (ref 98–111)
Creatinine, Ser: 1.1 mg/dL — ABNORMAL HIGH (ref 0.44–1.00)
Glucose, Bld: 92 mg/dL (ref 70–99)
HCT: 37 % (ref 36.0–46.0)
Hemoglobin: 12.6 g/dL (ref 12.0–15.0)
Potassium: 3.8 mmol/L (ref 3.5–5.1)
Sodium: 144 mmol/L (ref 135–145)
TCO2: 25 mmol/L (ref 22–32)

## 2023-08-27 LAB — COMPREHENSIVE METABOLIC PANEL
ALT: 17 U/L (ref 0–44)
AST: 23 U/L (ref 15–41)
Albumin: 4 g/dL (ref 3.5–5.0)
Alkaline Phosphatase: 54 U/L (ref 38–126)
Anion gap: 11 (ref 5–15)
BUN: 11 mg/dL (ref 6–20)
CO2: 20 mmol/L — ABNORMAL LOW (ref 22–32)
Calcium: 9.6 mg/dL (ref 8.9–10.3)
Chloride: 109 mmol/L (ref 98–111)
Creatinine, Ser: 0.79 mg/dL (ref 0.44–1.00)
GFR, Estimated: >60 mL/min (ref >60)
Glucose, Bld: 97 mg/dL (ref 70–99)
Potassium: 3.9 mmol/L (ref 3.5–5.1)
Sodium: 140 mmol/L (ref 135–145)
Total Bilirubin: 0.4 mg/dL (ref 0.0–1.2)
Total Protein: 7.7 g/dL (ref 6.5–8.1)

## 2023-08-27 LAB — POC URINE PREG, ED: Preg Test, Ur: NEGATIVE

## 2023-08-27 LAB — URINALYSIS, ROUTINE W REFLEX MICROSCOPIC
Bacteria, UA: NONE SEEN
Bilirubin Urine: NEGATIVE
Glucose, UA: NEGATIVE mg/dL
Hgb urine dipstick: NEGATIVE
Ketones, ur: NEGATIVE mg/dL
Nitrite: NEGATIVE
Protein, ur: NEGATIVE mg/dL
Specific Gravity, Urine: 1.002 — ABNORMAL LOW (ref 1.005–1.030)
pH: 7 (ref 5.0–8.0)

## 2023-08-27 LAB — RAPID URINE DRUG SCREEN, HOSP PERFORMED
Amphetamines: NOT DETECTED
Barbiturates: NOT DETECTED
Benzodiazepines: NOT DETECTED
Cocaine: NOT DETECTED
Opiates: NOT DETECTED
Tetrahydrocannabinol: NOT DETECTED

## 2023-08-27 LAB — I-STAT CG4 LACTIC ACID, ED: Lactic Acid, Venous: 1.7 mmol/L (ref 0.5–1.9)

## 2023-08-27 LAB — TROPONIN I (HIGH SENSITIVITY): Troponin I (High Sensitivity): <2 ng/L (ref <18)

## 2023-08-27 LAB — ETHANOL: Alcohol, Ethyl (B): 204 mg/dL — ABNORMAL HIGH (ref <10)

## 2023-08-27 MED ORDER — ONDANSETRON HCL 4 MG/2ML IJ SOLN
4.0000 mg | Freq: Once | INTRAMUSCULAR | Status: AC
  Administered 2023-08-27: 4 mg via INTRAVENOUS
  Filled 2023-08-27: qty 2

## 2023-08-27 NOTE — Discharge Instructions (Addendum)
 Rachel Ashley was seen in the ER today for her panic attack and alcohol intoxication. Her workup was otherwise reassuring. Return to the ER with any new severe symptoms.

## 2023-08-27 NOTE — ED Triage Notes (Signed)
 Pt BIB friend who states that he thought she was having a panic attack and then passed out. When rolled back here pt passed out again. Pt oriented to name.

## 2023-08-27 NOTE — ED Provider Notes (Signed)
 [place] [facility] Provider Note   CSN: [id number] Arrival date & time: 08/27/23  0250     History  Chief Complaint  Patient presents with   Altered Mental Status    Rachel Ashley is a 23 y.o. female patient was initially checked into the Emergency Department as a Doe, was assisted from the vehicle by ED staff with report of syncope.  Patient's friend brought to the emergency department with concern for panic attack.  According to friends that are at the bedside patient did drink approximately 5 shots of liquor tonight, became quite intoxicated, history of panic attacks per friends at the bedside.  Began to hyperventilate stating she could not breathe requesting go to the ER.  Level 5 caveat due to acuity of presentation upon arrival. Collateral history subsequently obtained from patient's fianc at the bedside who corroborates history of panic attacks.  HPI     Home Medications Prior to Admission medications   Not on File      Allergies    Patient has no allergy information on record.    Review of Systems   Review of Systems  Unable to perform ROS: Mental status change    Physical Exam Updated Vital Signs BP (!) 102/52   Pulse 81   Temp 98.1 F (36.7 C) (Oral)   Resp 18   SpO2 99%  Physical Exam Vitals and nursing note reviewed.  Constitutional:      Appearance: She is not ill-appearing or toxic-appearing.     Comments: Appears intoxicated.   HENT:     Head: Normocephalic and atraumatic.     Mouth/Throat:     Mouth: Mucous membranes are moist.     Pharynx: No oropharyngeal exudate or posterior oropharyngeal erythema.  Eyes:     General:        Right eye: No discharge.        Left eye: No discharge.     Conjunctiva/sclera: Conjunctivae normal.  Cardiovascular:     Rate and Rhythm: Regular rhythm. Tachycardia present.     Pulses: Normal pulses.     Heart sounds: Normal heart sounds. No murmur heard.    Comments:  Crying, HR normalized when calm.  Pulmonary:     Effort: Pulmonary effort is normal. No respiratory distress.     Breath sounds: Normal breath sounds. No wheezing or rales.  Abdominal:     General: Bowel sounds are normal. There is no distension.     Tenderness: There is no abdominal tenderness.  Musculoskeletal:        General: No deformity.     Cervical back: Neck supple.  Skin:    General: Skin is warm and dry.  Neurological:     Mental Status: She is alert.  Psychiatric:        Mood and Affect: Mood normal.     ED Results / Procedures / Treatments   Labs (all labs ordered are listed, but only abnormal results are displayed) Labs Reviewed  COMPREHENSIVE METABOLIC PANEL - Abnormal; Notable for the following components:      Result Value   CO2 20 (*)    All other components within normal limits  URINALYSIS, ROUTINE W REFLEX MICROSCOPIC - Abnormal; Notable for the following components:   Color, Urine STRAW (*)    Specific Gravity, Urine 1.002 (*)    Leukocytes,Ua SMALL (*)    All other components within normal limits  ETHANOL - Abnormal; Notable for the following components:  Alcohol, Ethyl (B) 204 (*)    All other components within normal limits  I-STAT CHEM 8, ED - Abnormal; Notable for the following components:   Creatinine, Ser 1.10 (*)    Calcium, Ion 1.12 (*)    All other components within normal limits  CBC WITH DIFFERENTIAL/PLATELET  RAPID URINE DRUG SCREEN, HOSP PERFORMED  I-STAT CG4 LACTIC ACID, ED  POC URINE PREG, ED  TROPONIN I (HIGH SENSITIVITY)    EKG None  Radiology DG Chest Portable 1 View Result Date: 08/27/2023 CLINICAL DATA:  Syncope EXAM: PORTABLE CHEST 1 VIEW COMPARISON:  None Available. FINDINGS: The heart size and mediastinal contours are within normal limits. Both lungs are clear. The visualized skeletal structures are unremarkable. IMPRESSION: No active disease. Electronically Signed   By: Franky Crease M.D.   On: 08/27/2023 03:16     Procedures Procedures    Medications Ordered in ED Medications  ondansetron  (ZOFRAN ) injection 4 mg (4 mg Intravenous Given 08/27/23 0500)    ED Course/ Medical Decision Making/ A&P                                 Medical Decision Making 23 year old female presents with concern for panic attack and EtOH with question of syncope.  Tachycardiac on intake, resolved after patient stopped crying.  Cardiopulmonary exam unremarkable, abdominal exam benign.  Patient atraumatic, PERRL, EOMI.  Amount and/or Complexity of Data Reviewed Labs: ordered.    Details: CBC without leukocytosis or anemia, CMP unremarkable, alcohol elevated to 204.  Troponin negative, UDS and UA unremarkable, patient is not pregnant. Radiology: ordered.  Risk Prescription drug management.   Patient's fianc is at the bedside, states that this happens from time to time when she is intoxicated.  Patient has been able to ambulate independently to and from the restroom, is calm and cooperative, oriented x 4 at this time.  Patient and her fianc have family in the parking lot who are waiting to transport them home.  Clinically at this time patient remains mildly intoxicated but is safe to be discharged home in the care of her sober family.  Maragret and her fianc voiced understanding of her medical evaluation and treatment plan. Each of their questions answered to their expressed satisfaction.  Return precautions were given.  Patient is well-appearing, stable, and was discharged in good condition.  This chart was dictated using voice recognition software, Dragon. Despite the best efforts of this provider to proofread and correct errors, errors may still occur which can change documentation meaning.         Final Clinical Impression(s) / ED Diagnoses Final diagnoses:  Alcoholic intoxication without complication [facility])    Rx / DC Orders ED Discharge Orders     None         Bobette Pleasant SAUNDERS,  PA-C 08/27/23 0636    Midge Golas, MD 08/27/23 [phone]

## 2023-09-13 ENCOUNTER — Other Ambulatory Visit: Payer: Self-pay

## 2023-09-13 ENCOUNTER — Emergency Department (HOSPITAL_BASED_OUTPATIENT_CLINIC_OR_DEPARTMENT_OTHER)
Admission: EM | Admit: 2023-09-13 | Discharge: 2023-09-13 | Disposition: A | Discharge status: Home / Self Care | Payer: MEDICAID | Attending: Emergency Medicine | Admitting: Emergency Medicine

## 2023-09-13 DIAGNOSIS — H5711 Ocular pain, right eye: Secondary | ICD-10-CM | POA: Insufficient documentation

## 2023-09-13 DIAGNOSIS — Z113 Encounter for screening for infections with a predominantly sexual mode of transmission: Secondary | ICD-10-CM | POA: Diagnosis not present

## 2023-09-13 LAB — URINALYSIS, ROUTINE W REFLEX MICROSCOPIC
Bilirubin Urine: NEGATIVE
Glucose, UA: NEGATIVE mg/dL
Hgb urine dipstick: NEGATIVE
Ketones, ur: NEGATIVE mg/dL
Leukocytes,Ua: NEGATIVE
Nitrite: NEGATIVE
Protein, ur: NEGATIVE mg/dL
Specific Gravity, Urine: 1.02 (ref 1.005–1.030)
pH: 7 (ref 5.0–8.0)

## 2023-09-13 LAB — HIV ANTIBODY (ROUTINE TESTING W REFLEX): HIV Screen 4th Generation wRfx: NONREACTIVE

## 2023-09-13 LAB — WET PREP, GENITAL
Sperm: NONE SEEN
Trich, Wet Prep: NONE SEEN
WBC, Wet Prep HPF POC: <10 (ref <10)
Yeast Wet Prep HPF POC: NONE SEEN

## 2023-09-13 MED ORDER — ERYTHROMYCIN 5 MG/GM OP OINT
TOPICAL_OINTMENT | Freq: Once | OPHTHALMIC | Status: AC
  Administered 2023-09-13: 1 via OPHTHALMIC
  Filled 2023-09-13: qty 3.5

## 2023-09-13 MED ORDER — FLUORESCEIN SODIUM 1 MG OP STRP
1.0000 | ORAL_STRIP | Freq: Once | OPHTHALMIC | Status: AC
Start: 2023-09-13 — End: 2023-09-13
  Administered 2023-09-13: 1 via OPHTHALMIC
  Filled 2023-09-13: qty 1

## 2023-09-13 MED ORDER — TETRACAINE HCL 0.5 % OP SOLN
2.0000 [drp] | Freq: Once | OPHTHALMIC | Status: AC
  Administered 2023-09-13: 2 [drp] via OPHTHALMIC
  Filled 2023-09-13: qty 4

## 2023-09-13 NOTE — ED Provider Notes (Signed)
[place] [facility] Provider Note   CSN: [id number] Arrival date & time: 09/13/23  1227     History  Chief Complaint  Patient presents with   Eye Pain    Rachel Ashley is a 23 y.o. female.  Patient presents to the emergency department for evaluation of right eye pain.  Symptoms started in the early morning hours.  She states that she awoke with pain in her right eye.  No activities where she felt like something went into her eye.  She was wearing lashes.  No contact lenses or history of eye surgeries.  No vision change.  She had some mild tearing, no purulent discharge.  No matting of the eyes.  No pain or swelling around the eyes.   Patient also requesting STI check.  No symptoms reported.       Home Medications Prior to Admission medications   Medication Sig Start Date End Date Taking? Authorizing Provider  metroNIDAZOLE (FLAGYL) 500 MG tablet Take 1 tablet (500 mg total) by mouth 2 (two) times daily. 03/16/23   Merrilee Jansky, MD  tacrolimus (PROTOPIC) 0.1 % ointment Apply topically 2 (two) times daily. 05/31/17   Mardella Layman, MD  triamcinolone ointment (KENALOG) 0.1 % Apply 1 application topically 2 (two) times daily. 02/06/18   Cathie Hoops, Amy V, PA-C      Allergies    Red dye #40 (allura red)    Review of Systems   Review of Systems  Physical Exam Updated Vital Signs BP (!) 125/92 (BP Location: Left Arm)   Pulse 77   Temp 98.1 F (36.7 C) (Oral)   Resp 16   Ht 5\' 8"  (1.727 m)   Wt 90.7 kg   LMP 09/12/2023   SpO2 100%   BMI 30.41 kg/m  Physical Exam Vitals and nursing note reviewed.  Constitutional:      Appearance: She is well-developed.  HENT:     Head: Normocephalic and atraumatic.  Eyes:     General: Lids are normal.     Extraocular Movements:     Right eye: Normal extraocular motion and no nystagmus.     Left eye: Normal extraocular motion and no nystagmus.     Conjunctiva/sclera:     Right eye: Right  conjunctiva is injected (Minimal). No chemosis or exudate.    Left eye: Left conjunctiva is not injected. No chemosis or exudate.    Pupils: Pupils are equal, round, and reactive to light.     Right eye: Pupil is round, reactive and not sluggish. No corneal abrasion or fluorescein uptake.     Left eye: Pupil is round, reactive and not sluggish.  Pulmonary:     Effort: No respiratory distress.  Musculoskeletal:     Cervical back: Normal range of motion and neck supple.  Skin:    General: Skin is warm and dry.  Neurological:     Mental Status: She is alert.     ED Results / Procedures / Treatments   Labs (all labs ordered are listed, but only abnormal results are displayed) Labs Reviewed  WET PREP, GENITAL - Abnormal; Notable for the following components:      Result Value   Clue Cells Wet Prep HPF POC PRESENT (*)    All other components within normal limits  URINALYSIS, ROUTINE W REFLEX MICROSCOPIC  RPR  HIV ANTIBODY (ROUTINE TESTING W REFLEX)  GC/CHLAMYDIA PROBE AMP ([place]) NOT AT [facility]    EKG None  Radiology No  results found.  Procedures Procedures    Medications Ordered in ED Medications  fluorescein ophthalmic strip 1 strip (has no administration in time range)  tetracaine (PONTOCAINE) 0.5 % ophthalmic solution 2 drop (has no administration in time range)    ED Course/ Medical Decision Making/ A&P    Patient seen and examined. History obtained directly from patient.   Labs/EKG: None ordered  Imaging: None ordered  Medications/Fluids: Ordered: Fluorescein, tetracaine  Most recent vital signs reviewed and are as follows: BP (!) 125/92 (BP Location: Left Arm)   Pulse 77   Temp 98.1 F (36.7 C) (Oral)   Resp 16   Ht 5\' 8"  (1.727 m)   Wt 90.7 kg   LMP 09/12/2023   SpO2 100%   BMI 30.41 kg/m   Initial impression: Right eye pain, encounter for STI screening    Visual Acuity  Right Eye Distance:   Left Eye Distance:   Bilateral Distance:     Right Eye Near: R Near: 20/30 Left Eye Near:  L Near: 20/30 Bilateral Near:  20/25  Home treatment plan: Erythromycin ointment  Return instructions discussed with patient: New or worsening symptoms, worsening eye pain, vision change, swelling or redness around the eye  Follow-up instructions discussed with patient: Ophthalmology in 48 hours if not improving                                Medical Decision Making Amount and/or Complexity of Data Reviewed Labs: ordered.  Risk Prescription drug management.    Patient with right eye pain.  She was concerned about having something in her eye.  No foreign bodies noted. No surrounding erythema, swelling, vision changes/loss suspicious for orbital or periorbital cellulitis. No signs of iritis.  Low concern for glaucoma based on symptoms.  No symptoms of retinal detachment. No ophthalmologic emergency suspected. Outpatient referral given in case of no improvement.   Also requesting STI screening, asymptomatic.  No indication for treatment at this time.         Final Clinical Impression(s) / ED Diagnoses Final diagnoses:  Acute right eye pain  Screening for STDs (sexually transmitted diseases)    Rx / DC Orders ED Discharge Orders     None         Renne Crigler, PA-C 09/13/23 1510    Virgina Norfolk, DO 09/14/23 [phone]

## 2023-09-13 NOTE — Discharge Instructions (Signed)
Please read and follow all provided instructions.  Your diagnoses today include:  1. Acute right eye pain   2. Screening for STDs (sexually transmitted diseases)     Tests performed today include: Visual acuity testing to check your vision Fluorescein dye examination to look for scratches on your eye Vital signs. See below for your results today.   Medications prescribed:  Erythromycin  - antibiotic eye ointment  Use this medication as follows: Apply 1/4" of the antibiotic ointment to affected eye up to 6 times a day while awake for 7 days  Take any prescribed medications only as directed.  Home care instructions:  Follow any educational materials contained in this packet. If you wear contact lenses, do not use them until your eye caregiver approves. Follow-up care is necessary to be sure the infection is healing if not completely resolved in 2-3 days. See your caregiver or eye specialist as suggested for followup.   If you have an eye infection, wash your hands often as this is very contagious and is easily spread from person to person.   Follow-up instructions: Please follow-up with the opthalmologist listed in the next 2-3 days for further evaluation of your symptoms if not improving.  Return instructions:  Please return to the Emergency Department if you experience worsening symptoms.  Please return immediately if you develop severe pain, pus drainage, new change in vision, or fever. Please return if you have any other emergent concerns.  Additional Information:  Your vital signs today were: BP (!) 125/92 (BP Location: Left Arm)   Pulse 77   Temp 98.1 F (36.7 C) (Oral)   Resp 16   Ht 5\' 8"  (1.727 m)   Wt 90.7 kg   LMP 09/12/2023   SpO2 100%   BMI 30.41 kg/m  If your blood pressure (BP) was elevated above 135/85 this visit, please have this repeated by your doctor within one month. ---------------

## 2023-09-13 NOTE — ED Triage Notes (Signed)
Pt reports that she has something in her right eye. States that it has been there since last night. States that he vision is clear. Pt also would like a STD check.

## 2023-09-14 LAB — GC/CHLAMYDIA PROBE AMP (~~LOC~~) NOT AT ARMC
Chlamydia: NEGATIVE
Comment: NEGATIVE
Comment: NORMAL
Neisseria Gonorrhea: NEGATIVE

## 2023-09-14 LAB — RPR: RPR Ser Ql: NONREACTIVE

## 2023-12-22 ENCOUNTER — Ambulatory Visit (HOSPITAL_COMMUNITY): Payer: MEDICAID

## 2024-01-15 ENCOUNTER — Ambulatory Visit (HOSPITAL_COMMUNITY)
Admission: EM | Admit: 2024-01-15 | Discharge: 2024-01-15 | Disposition: A | Discharge status: Home / Self Care | Payer: MEDICAID | Attending: Emergency Medicine | Admitting: Emergency Medicine

## 2024-01-15 ENCOUNTER — Encounter (HOSPITAL_COMMUNITY): Payer: Self-pay

## 2024-01-15 DIAGNOSIS — A084 Viral intestinal infection, unspecified: Secondary | ICD-10-CM | POA: Diagnosis not present

## 2024-01-15 DIAGNOSIS — R112 Nausea with vomiting, unspecified: Secondary | ICD-10-CM

## 2024-01-15 DIAGNOSIS — R197 Diarrhea, unspecified: Secondary | ICD-10-CM | POA: Diagnosis not present

## 2024-01-15 DIAGNOSIS — Z3202 Encounter for pregnancy test, result negative: Secondary | ICD-10-CM

## 2024-01-15 LAB — POCT URINALYSIS DIP (MANUAL ENTRY)
Bilirubin, UA: NEGATIVE
Blood, UA: NEGATIVE
Glucose, UA: NEGATIVE mg/dL
Ketones, POC UA: NEGATIVE mg/dL
Leukocytes, UA: NEGATIVE
Nitrite, UA: NEGATIVE
Protein Ur, POC: NEGATIVE mg/dL
Spec Grav, UA: 1.025 (ref 1.010–1.025)
Urobilinogen, UA: 0.2 U/dL
pH, UA: 6 (ref 5.0–8.0)

## 2024-01-15 LAB — POCT URINE PREGNANCY: Preg Test, Ur: NEGATIVE

## 2024-01-15 MED ORDER — ONDANSETRON 4 MG PO TBDP: 4.0000 mg | ORAL_TABLET | Freq: Three times a day (TID) | ORAL | 0 refills | Status: AC | PRN

## 2024-01-15 NOTE — ED Provider Notes (Signed)
 [facility]    CSN: [id number] Arrival date & time: 01/15/24  1222      History   Chief Complaint Chief Complaint  Patient presents with   Abdominal Pain    HPI Rachel Ashley is a 23 y.o. female.   Patient presents with abdominal cramping, nausea, vomiting, and diarrhea that began around 9 AM this morning.  Patient states that she has had 2 episodes of vomiting and 2 episodes of diarrhea since 9 AM this morning.  Denies any blood in vomit or stool, recent issues with constipation, fever, body aches, chills, dysuria, urinary frequency/urgency, hematuria, abnormal vaginal discharge or bleeding.  LMP 01/01/2024.  Patient denies taking any medication for symptoms.  Patient denies eating anything new or any concerns for food poisoning.  Patient states that she had some chicken wings yesterday which is not out of the ordinary for her.  Denies any recent alcohol use.  The history is provided by the patient and medical records.  Abdominal Pain   Past Medical History:  Diagnosis Date   Eczema     There are no active problems to display for this patient.   History reviewed. No pertinent surgical history.  OB History   No obstetric history on file.      Home Medications    Prior to Admission medications   Medication Sig Start Date End Date Taking? Authorizing Provider  ondansetron  (ZOFRAN -ODT) 4 MG disintegrating tablet Take 1 tablet (4 mg total) by mouth every 8 (eight) hours as needed for nausea or vomiting. 01/15/24  Yes Levora Reas A, NP  triamcinolone  ointment (KENALOG ) 0.1 % Apply 1 application topically 2 (two) times daily. 02/06/18   Alfrieda Antes, PA-C    Family History Family History  Problem Relation Age of Onset   Healthy Mother    Healthy Father     Social History Social History   Tobacco Use   Smoking status: Never   Smokeless tobacco: Never  Vaping Use   Vaping status: Never Used  Substance Use Topics   Alcohol use: Yes   Drug use: Not  Currently    Types: Marijuana     Allergies   Red dye #40 (allura red)   Review of Systems Review of Systems  Gastrointestinal:  Positive for abdominal pain.   Per HPI  Physical Exam Triage Vital Signs ED Triage Vitals  Encounter Vitals Group     BP 01/15/24 1245 124/86     Systolic BP Percentile --      Diastolic BP Percentile --      Pulse Rate 01/15/24 1245 95     Resp 01/15/24 1245 16     Temp 01/15/24 1245 98.4 F (36.9 C)     Temp Source 01/15/24 1245 Oral     SpO2 01/15/24 1245 97 %     Weight --      Height --      Head Circumference --      Peak Flow --      Pain Score 01/15/24 1248 7     Pain Loc --      Pain Education --      Exclude from Growth Chart --    No data found.  Updated Vital Signs BP 124/86 (BP Location: Left Arm)   Pulse 95   Temp 98.4 F (36.9 C) (Oral)   Resp 16   LMP 01/01/2024 (Approximate)   SpO2 97%   Visual Acuity Right Eye Distance:   Left Eye Distance:  Bilateral Distance:    Right Eye Near:   Left Eye Near:    Bilateral Near:     Physical Exam Vitals and nursing note reviewed.  Constitutional:      General: She is awake. She is not in acute distress.    Appearance: Normal appearance. She is well-developed. She is not ill-appearing.  Abdominal:     General: Abdomen is flat. Bowel sounds are normal.     Palpations: Abdomen is soft.     Tenderness: There is no abdominal tenderness. There is no guarding or rebound.  Skin:    General: Skin is warm and dry.  Neurological:     Mental Status: She is alert.  Psychiatric:        Behavior: Behavior is cooperative.      UC Treatments / Results  Labs (all labs ordered are listed, but only abnormal results are displayed) Labs Reviewed  POCT URINE PREGNANCY - Normal  POCT URINALYSIS DIP (MANUAL ENTRY)    EKG   Radiology No results found.  Procedures Procedures (including critical care time)  Medications Ordered in UC Medications - No data to  display  Initial Impression / Assessment and Plan / UC Course  I have reviewed the triage vital signs and the nursing notes.  Pertinent labs & imaging results that were available during my care of the patient were reviewed by me and considered in my medical decision making (see chart for details).     Patient is well-appearing.  Vitals are stable.  Abdomen is flat, soft, and nontender upon palpation.  Bowel sounds are normal.  Without signs of acute abdomen.  Patient endorses constant central abdominal cramping and urge to have diarrhea.  Urinalysis unremarkable.  Urine pregnancy negative.  Symptoms likely viral in nature.  Prescribe Zofran  as needed for nausea and vomiting.  Recommended Imodium as needed for any diarrhea.  Discussed importance of hydration.  Discussed return and strict ER precautions. Final Clinical Impressions(s) / UC Diagnoses   Final diagnoses:  Nausea vomiting and diarrhea  Viral gastroenteritis     Discharge Instructions      Your urinalysis does not reveal any signs of infection. I believe your symptoms are likely related to a viral gastroenteritis, or stomach virus. You can take Zofran  every 8 hours as needed for nausea and vomiting. You can take over-the-counter Imodium 4 times daily as needed for any diarrhea. Make sure you are staying hydrated and getting some rest. Return here as needed. If you develop worsening abdominal pain, excessive vomiting, excessive diarrhea, blood in vomit or stool, or high fevers unrelieved by medication please seek immediate medical treatment in the emergency department.   ED Prescriptions     Medication Sig Dispense Auth. Provider   ondansetron  (ZOFRAN -ODT) 4 MG disintegrating tablet Take 1 tablet (4 mg total) by mouth every 8 (eight) hours as needed for nausea or vomiting. 10 tablet Levora Reas A, NP      PDMP not reviewed this encounter.   Levora Reas A, NP 01/15/24 1332

## 2024-01-15 NOTE — ED Triage Notes (Signed)
 Patient here today with c/o abd pain, nausea, vomiting, and diarrhea this morning. Patient states that she vomiting twice today.

## 2024-01-15 NOTE — Discharge Instructions (Addendum)
 Your urinalysis does not reveal any signs of infection. I believe your symptoms are likely related to a viral gastroenteritis, or stomach virus. You can take Zofran  every 8 hours as needed for nausea and vomiting. You can take over-the-counter Imodium 4 times daily as needed for any diarrhea. Make sure you are staying hydrated and getting some rest. Return here as needed. If you develop worsening abdominal pain, excessive vomiting, excessive diarrhea, blood in vomit or stool, or high fevers unrelieved by medication please seek immediate medical treatment in the emergency department.

## 2024-02-19 ENCOUNTER — Other Ambulatory Visit: Payer: Self-pay

## 2024-02-19 ENCOUNTER — Telehealth (HOSPITAL_COMMUNITY): Payer: Self-pay | Admitting: Radiology

## 2024-02-19 ENCOUNTER — Encounter (HOSPITAL_COMMUNITY): Payer: Self-pay | Admitting: Emergency Medicine

## 2024-02-19 ENCOUNTER — Ambulatory Visit (HOSPITAL_COMMUNITY)
Admission: EM | Admit: 2024-02-19 | Discharge: 2024-02-19 | Disposition: A | Discharge status: Home / Self Care | Payer: MEDICAID | Attending: Emergency Medicine | Admitting: Emergency Medicine

## 2024-02-19 ENCOUNTER — Ambulatory Visit (HOSPITAL_COMMUNITY): Payer: Self-pay | Admitting: Emergency Medicine

## 2024-02-19 DIAGNOSIS — Z113 Encounter for screening for infections with a predominantly sexual mode of transmission: Secondary | ICD-10-CM | POA: Diagnosis not present

## 2024-02-19 LAB — HIV ANTIBODY (ROUTINE TESTING W REFLEX): HIV Screen 4th Generation wRfx: NONREACTIVE

## 2024-02-19 NOTE — ED Triage Notes (Signed)
Pt requesting to be check for STD.

## 2024-02-19 NOTE — ED Provider Notes (Signed)
 [facility]    CSN: [id number] Arrival date & time: 02/19/24  1157    HISTORY   Chief Complaint  Patient presents with   Exposure to STD   HPI Rachel Ashley is a pleasant, 23 y.o. female who presents to urgent care today. Patient requests routine STD screening.   Patient denies abnormal vaginal discharge, abnormal vaginal odor, vaginal itching, vaginal irritation, dyspareunia, rash, suprapubic pain, genital lesion , burning during urination, increased frequency of urination, fever , body aches, chills, rigors, malaise, significant fatigue, and known STD exposure.   The history is provided by the patient.  Exposure to STD    Past Medical History:  Diagnosis Date   Eczema    There are no active problems to display for this patient.  History reviewed. No pertinent surgical history. OB History   No obstetric history on file.    Home Medications    Prior to Admission medications   Medication Sig Start Date End Date Taking? Authorizing Provider  ondansetron  (ZOFRAN -ODT) 4 MG disintegrating tablet Take 1 tablet (4 mg total) by mouth every 8 (eight) hours as needed for nausea or vomiting. 01/15/24   Johnie Flaming A, NP  triamcinolone  ointment (KENALOG ) 0.1 % Apply 1 application topically 2 (two) times daily. 02/06/18   Babara Greig GAILS, PA-C    Family History Family History  Problem Relation Age of Onset   Healthy Mother    Healthy Father    Social History Social History   Tobacco Use   Smoking status: Never   Smokeless tobacco: Never  Vaping Use   Vaping status: Never Used  Substance Use Topics   Alcohol use: Yes   Drug use: Not Currently    Types: Marijuana   Allergies   Red dye #40 (allura red)  Review of Systems Review of Systems Pertinent findings revealed after performing a 14 point review of systems has been noted in the history of present illness.  Physical Exam Vital Signs BP 116/79 (BP Location: Right Arm)   Pulse 100   Temp 98 F (36.7  C) (Oral)   Resp 16   LMP 01/29/2024 (Approximate)   SpO2 100%   No data found.  Physical Exam Vitals and nursing note reviewed.  Constitutional:      General: She is not in acute distress.    Appearance: Normal appearance. She is not ill-appearing.  HENT:     Head: Normocephalic and atraumatic.   Eyes:     General: Lids are normal.        Right eye: No discharge.        Left eye: No discharge.     Conjunctiva/sclera: Conjunctivae normal.     Right eye: Right conjunctiva is not injected.     Left eye: Left conjunctiva is not injected.   Neck:     Trachea: Trachea and phonation normal.   Cardiovascular:     Rate and Rhythm: Normal rate and regular rhythm.  Pulmonary:     Effort: Pulmonary effort is normal. No accessory muscle usage or prolonged expiration.     Breath sounds: No decreased air movement or transmitted upper airway sounds. No decreased breath sounds.  Genitourinary:    Comments: Patient politely declines pelvic exam today, patient provided a vaginal swab for testing.  Musculoskeletal:        General: Normal range of motion.     Cervical back: Normal range of motion and neck supple. Normal range of motion.   Skin:  General: Skin is warm and dry.   Neurological:     General: No focal deficit present.     Mental Status: She is alert and oriented to person, place, and time.   Psychiatric:        Mood and Affect: Mood normal.        Behavior: Behavior normal.        Thought Content: Thought content normal.        Judgment: Judgment normal.     Visual Acuity Right Eye Distance:   Left Eye Distance:   Bilateral Distance:    Right Eye Near:   Left Eye Near:    Bilateral Near:     UC Couse / Diagnostics / Procedures:     Radiology No results found.  Procedures Procedures (including critical care time) EKG  Pending results:  Labs Reviewed  HIV ANTIBODY (ROUTINE TESTING W REFLEX)  RPR  CERVICOVAGINAL ANCILLARY ONLY    Medications  Ordered in UC: Medications - No data to display  UC Diagnoses / Final Clinical Impressions(s)   I have reviewed the triage vital signs and the nursing notes.  Pertinent labs & imaging results that were available during my care of the patient were reviewed by me and considered in my medical decision making (see chart for details).    Final diagnoses:  Screen for STD (sexually transmitted disease)   STD screening was performed, patient advised that the results be posted to their MyChart and if any of the results are positive, they will be notified by phone, further treatment will be provided as indicated based on results of STD screening. Patient was advised to abstain from sexual intercourse until that they receive the results of their STD testing.  Patient was also advised to use condoms to protect themselves from STD exposure. Return precautions advised.  Drug allergies reviewed, all questions addressed.   Please see discharge instructions below for details of plan of care as provided to patient. ED Prescriptions   None    PDMP not reviewed this encounter.  Disposition Upon Discharge:  Condition: stable for discharge home  Patient presented with concern for an acute illness with associated systemic symptoms and significant discomfort requiring urgent management. In my opinion, this is a condition that a prudent lay person (someone who possesses an average knowledge of health and medicine) may potentially expect to result in complications if not addressed urgently such as respiratory distress, impairment of bodily function or dysfunction of bodily organs.   As such, the patient has been evaluated and assessed, work-up was performed and treatment was provided in alignment with urgent care protocols and evidence based medicine.  Patient/parent/caregiver has been advised that the patient may require follow up for further testing and/or treatment if the symptoms continue in spite of treatment,  as clinically indicated and appropriate.  Routine symptom specific, illness specific and/or disease specific instructions were discussed with the patient and/or caregiver at length.  Prevention strategies for avoiding STD exposure were also discussed.  The patient will follow up with their current PCP if and as advised. If the patient does not currently have a PCP we will assist them in obtaining one.   The patient may need specialty follow up if the symptoms continue, in spite of conservative treatment and management, for further workup, evaluation, consultation and treatment as clinically indicated and appropriate.  Patient/parent/caregiver verbalized understanding and agreement of plan as discussed.  All questions were addressed during visit.  Please see discharge instructions below for  further details of plan.    Discharge Instructions      The results of your vaginal swab test which screens for BV, yeast, gonorrhea, chlamydia and trichomonas will be posted to your MyChart account once it is complete.  This typically takes 2 to 4 days.  Please abstain from sexual intercourse of any kind, vaginal, oral or anal, until you have received the results of your STD testing.      The results of your HIV and syphilis blood tests will be made available to you once they are complete.  They will initially be posted to your MyChart account which typically takes 2 to 3 days.      Please remember that there are only 2 ways to prevent transmission of sexually transmitted disease: to not have sex or to always use condoms when having sex.       Thank you for visiting [facility] today.  We appreciate the opportunity to participate in your care.      This office note has been dictated using Teaching laboratory technician.  Unfortunately, this method of dictation can sometimes lead to typographical or grammatical errors.  I apologize for your inconvenience in advance if this occurs.  Please  do not hesitate to reach out to me if clarification is needed.       Joesph Shaver Scales, PA-C 02/19/24 1243

## 2024-02-19 NOTE — Telephone Encounter (Signed)
 Went to pt's room to draw blood and pt was not there. After asking nurse and provider we realized pt had left before blood draw. Pt was called 3 times on phone number provided on their chart with no answer.

## 2024-02-19 NOTE — Discharge Instructions (Addendum)
 The results of your vaginal swab test which screens for BV, yeast, gonorrhea, chlamydia and trichomonas will be posted to your MyChart account once it is complete.  This typically takes 2 to 4 days.  Please abstain from sexual intercourse of any kind, vaginal, oral or anal, until you have received the results of your STD testing.      The results of your HIV and syphilis blood tests will be made available to you once they are complete.  They will initially be posted to your MyChart account which typically takes 2 to 3 days.      Please remember that there are only 2 ways to prevent transmission of sexually transmitted disease: to not have sex or to always use condoms when having sex.       Thank you for visiting [facility] today.  We appreciate the opportunity to participate in your care.

## 2024-02-20 LAB — RPR: RPR Ser Ql: NONREACTIVE

## 2024-02-21 LAB — CERVICOVAGINAL ANCILLARY ONLY
Bacterial Vaginitis (gardnerella): POSITIVE — AB
Candida Glabrata: NEGATIVE
Candida Vaginitis: NEGATIVE
Chlamydia: NEGATIVE
Comment: NEGATIVE
Comment: NEGATIVE
Comment: NEGATIVE
Comment: NEGATIVE
Comment: NEGATIVE
Comment: NORMAL
Neisseria Gonorrhea: NEGATIVE
Trichomonas: NEGATIVE

## 2024-05-22 ENCOUNTER — Ambulatory Visit (HOSPITAL_COMMUNITY): Payer: MEDICAID

## 2024-08-29 ENCOUNTER — Encounter (HOSPITAL_COMMUNITY): Payer: Self-pay

## 2024-08-29 ENCOUNTER — Other Ambulatory Visit: Payer: Self-pay

## 2024-08-29 ENCOUNTER — Emergency Department (HOSPITAL_COMMUNITY)
Admission: EM | Admit: 2024-08-29 | Discharge: 2024-08-30 | Disposition: A | Discharge status: Home / Self Care | Payer: MEDICAID | Attending: Emergency Medicine | Admitting: Emergency Medicine

## 2024-08-29 DIAGNOSIS — F32A Depression, unspecified: Secondary | ICD-10-CM | POA: Diagnosis present

## 2024-08-29 LAB — ACETAMINOPHEN LEVEL: Acetaminophen (Tylenol), Serum: <10 ug/mL — ABNORMAL LOW (ref 10–30)

## 2024-08-29 LAB — CBC WITH DIFFERENTIAL/PLATELET
Abs Immature Granulocytes: 0.01 K/uL (ref 0.00–0.07)
Basophils Absolute: 0 K/uL (ref 0.0–0.1)
Basophils Relative: 1 %
Eosinophils Absolute: 0.1 K/uL (ref 0.0–0.5)
Eosinophils Relative: 2 %
HCT: 37.8 % (ref 36.0–46.0)
Hemoglobin: 13 g/dL (ref 12.0–15.0)
Immature Granulocytes: 0 %
Lymphocytes Relative: 37 %
Lymphs Abs: 1.6 K/uL (ref 0.7–4.0)
MCH: 32.2 pg (ref 26.0–34.0)
MCHC: 34.4 g/dL (ref 30.0–36.0)
MCV: 93.6 fL (ref 80.0–100.0)
Monocytes Absolute: 0.5 K/uL (ref 0.1–1.0)
Monocytes Relative: 11 %
Neutro Abs: 2.1 K/uL (ref 1.7–7.7)
Neutrophils Relative %: 49 %
Platelets: 262 K/uL (ref 150–400)
RBC: 4.04 MIL/uL (ref 3.87–5.11)
RDW: 12 % (ref 11.5–15.5)
WBC: 4.4 K/uL (ref 4.0–10.5)
nRBC: 0 % (ref 0.0–0.2)

## 2024-08-29 LAB — BASIC METABOLIC PANEL WITH GFR
Anion gap: 10 (ref 5–15)
BUN: 7 mg/dL (ref 6–20)
CO2: 23 mmol/L (ref 22–32)
Calcium: 9.1 mg/dL (ref 8.9–10.3)
Chloride: 104 mmol/L (ref 98–111)
Creatinine, Ser: 0.83 mg/dL (ref 0.44–1.00)
GFR, Estimated: >60 mL/min
Glucose, Bld: 82 mg/dL (ref 70–99)
Potassium: 3.5 mmol/L (ref 3.5–5.1)
Sodium: 137 mmol/L (ref 135–145)

## 2024-08-29 LAB — HCG, SERUM, QUALITATIVE: Preg, Serum: NEGATIVE

## 2024-08-29 LAB — ETHANOL: Alcohol, Ethyl (B): <15 mg/dL

## 2024-08-29 LAB — SALICYLATE LEVEL: Salicylate Lvl: <7 mg/dL — ABNORMAL LOW (ref 7.0–30.0)

## 2024-08-29 MED ORDER — LORAZEPAM 0.5 MG PO TABS
0.5000 mg | ORAL_TABLET | Freq: Once | ORAL | Status: AC
  Administered 2024-08-29: 0.5 mg via ORAL
  Filled 2024-08-29: qty 1

## 2024-08-29 NOTE — ED Provider Triage Note (Signed)
 Emergency Medicine Provider Triage Evaluation Note  Rachel Ashley , a 24 y.o. female  was evaluated in triage.  Pt complains of SI without plan.  Reports anxiety and depression without official diagnosis.  Review of Systems  Positive: SI without plan Negative: HI, AVH, previous psych history, any other symptoms at this time  Physical Exam  BP 110/86 (BP Location: Right Arm)   Pulse 91   Temp 98.5 F (36.9 C)   Resp 16   Wt 86.6 kg   LMP 08/27/2024 (Exact Date)   SpO2 94%   BMI 29.04 kg/m  Gen:   Awake, no distress   Resp:  Normal effort  MSK:   Moves extremities without difficulty  Other:    Medical Decision Making  Medically screening exam initiated at 5:19 PM.  Appropriate orders placed.  Devi Hopman was informed that the remainder of the evaluation will be completed by another provider, this initial triage assessment does not replace that evaluation, and the importance of remaining in the ED until their evaluation is complete.  Labs and medication ordered   Javae Braaten N, PA-C 08/29/24 1724

## 2024-08-29 NOTE — ED Triage Notes (Signed)
 Patient here POV with suicidal thoughts. She has no plan she is just having thoughts of wanting to kill herself. She states that the thoughts started 4 months ago but they did not get bad until 2 weeks ago.  No previous mental health diagnoses.

## 2024-08-29 NOTE — ED Triage Notes (Signed)
 Pt wishes not to have mother here.

## 2024-08-30 NOTE — Discharge Instructions (Addendum)
 Please seek immediate help if you become suicidal.  You may follow-up at the behavioral health urgent care for mental health needs. I've also provided outpatient mental health resources.

## 2024-08-30 NOTE — ED Provider Notes (Signed)
 " [place] [facility] Provider Note   CSN: [id number] Arrival date & time: 08/29/24  1643     Patient presents with: Suicidal   Rachel Ashley is a 24 y.o. female.  Patient with no documented mental health history presents to the emergent department complaining of vague suicidal thoughts.  She states she has had some depression over the past 4 months and over the past 2 weeks had some worsening thoughts.  At the time of my assessment the patient denies suicidal intent.  She states she is depressed but tells me she has no intention to hurt herself.  She is requesting mental health resources.  She denies other complaints at this time.   HPI     Prior to Admission medications  Medication Sig Start Date End Date Taking? Authorizing Provider  ondansetron  (ZOFRAN -ODT) 4 MG disintegrating tablet Take 1 tablet (4 mg total) by mouth every 8 (eight) hours as needed for nausea or vomiting. 01/15/24   Johnie Flaming A, NP  triamcinolone  ointment (KENALOG ) 0.1 % Apply 1 application topically 2 (two) times daily. 02/06/18   Babara, Amy V, PA-C    Allergies: Red dye #40 (allura red)    Review of Systems  Updated Vital Signs BP 110/86 (BP Location: Right Arm)   Pulse 91   Temp 98.5 F (36.9 C)   Resp 16   Wt 86.6 kg   LMP 08/27/2024 (Exact Date)   SpO2 94%   BMI 29.04 kg/m   Physical Exam Vitals and nursing note reviewed.  HENT:     Head: Normocephalic and atraumatic.  Eyes:     Pupils: Pupils are equal, round, and reactive to light.  Pulmonary:     Effort: Pulmonary effort is normal. No respiratory distress.  Musculoskeletal:        General: No signs of injury.     Cervical back: Normal range of motion.  Skin:    General: Skin is dry.  Neurological:     Mental Status: She is alert.  Psychiatric:        Speech: Speech normal.        Behavior: Behavior normal.     (all labs ordered are listed, but only abnormal results are displayed) Labs  Reviewed  ACETAMINOPHEN  LEVEL - Abnormal; Notable for the following components:      Result Value   Acetaminophen  (Tylenol ), Serum <10 (*)    All other components within normal limits  SALICYLATE LEVEL - Abnormal; Notable for the following components:   Salicylate Lvl <7.0 (*)    All other components within normal limits  BASIC METABOLIC PANEL WITH GFR  ETHANOL  CBC WITH DIFFERENTIAL/PLATELET  HCG, SERUM, QUALITATIVE    EKG: None  Radiology: No results found.   Procedures   Medications Ordered in the ED  LORazepam  (ATIVAN ) tablet 0.5 mg (0.5 mg Oral Given 08/29/24 1730)                                    Medical Decision Making  Patient presenting to the emergency department with an initial complaint of suicidal ideation.  Upon my assessment she states she is depressed but not suicidal.  She tells me she has no intent to harm herself and would just like resources so that she can get in touch with mental health.  She does not wish to wait in the emergency department to talk to a  counselor this evening.  At this time it seems reasonable to allow the patient to discharge home with outpatient mental health resources provided.  She is denying a plan and currently denying suicidal ideations.     Final diagnoses:  Depression, unspecified depression type    ED Discharge Orders     None          Logan Ubaldo KATHEE DEVONNA 08/30/24 ARMENTA Darra Fonda KANDICE, MD 08/30/24 [phone]  "
